# Patient Record
Sex: Male | Born: 1940 | Race: Black or African American | Smoking: Never smoker
Health system: Southern US, Community
[De-identification: ages and names within clinical notes are randomized; demographics above are authoritative.]

## PROBLEM LIST (undated history)

## (undated) DIAGNOSIS — I10 Essential (primary) hypertension: Secondary | ICD-10-CM

## (undated) DIAGNOSIS — E119 Type 2 diabetes mellitus without complications: Secondary | ICD-10-CM

## (undated) DIAGNOSIS — H409 Unspecified glaucoma: Secondary | ICD-10-CM

## (undated) DIAGNOSIS — C61 Malignant neoplasm of prostate: Secondary | ICD-10-CM

## (undated) HISTORY — PX: TONSILECTOMY/ADENOIDECTOMY WITH MYRINGOTOMY: SHX6125

## (undated) HISTORY — PX: PROSTATE BIOPSY: SHX241

---

## 2015-12-25 ENCOUNTER — Ambulatory Visit
Admission: RE | Admit: 2015-12-25 | Discharge: 2015-12-25 | Disposition: A | Discharge status: Home / Self Care | Payer: Medicare Other | Source: Ambulatory Visit | Attending: Radiation Oncology | Admitting: Radiation Oncology

## 2015-12-25 ENCOUNTER — Encounter: Payer: Self-pay | Admitting: Radiation Oncology

## 2015-12-25 ENCOUNTER — Encounter: Payer: Self-pay | Admitting: Medical Oncology

## 2015-12-25 VITALS — BP 174/84 | HR 84 | Wt 211.1 lb

## 2015-12-25 DIAGNOSIS — C61 Malignant neoplasm of prostate: Secondary | ICD-10-CM

## 2015-12-25 DIAGNOSIS — Z510 Encounter for antineoplastic radiation therapy: Secondary | ICD-10-CM | POA: Diagnosis not present

## 2015-12-25 HISTORY — DX: Type 2 diabetes mellitus without complications: E11.9

## 2015-12-25 HISTORY — DX: Essential (primary) hypertension: I10

## 2015-12-25 HISTORY — DX: Malignant neoplasm of prostate: C61

## 2015-12-25 HISTORY — DX: Unspecified glaucoma: H40.9

## 2015-12-25 NOTE — Progress Notes (Signed)
See progress note under physician encounter. 

## 2015-12-25 NOTE — Progress Notes (Signed)
Radiation Oncology         [phone] ________________________________  Initial Outpatient Consultation  Name: Christian Fry MRN: [id number]  Date: 12/25/2015  DOB: 04-27-1941  CC:No primary care provider on file.  Festus Aloe, MD   REFERRING PHYSICIAN: Festus Aloe, MD  DIAGNOSIS: The encounter diagnosis was Malignant neoplasm of prostate [facility]).    ICD-9-CM ICD-10-CM   1. Malignant neoplasm of prostate ([place]) 185 C61     HISTORY OF PRESENT ILLNESS: Christian Fry is a 75 y.o. male with a newly diagnosed prostate cancer. He has been seeing Dr. Janit Bern since 2009 and they have been checking his PSA. He was noted to have an elevated PSA of 5.53 on 06/25/15.  Accordingly, he was referred for evaluation in urology by Dr. Junious Silk on August 08, 2015, his PSA was repeated and was 4.89, digital rectal examination was performed at that time revealing questionable nodule, though on ultrasound this confirmed no nodules.  The patient proceeded to transrectal ultrasound with 12 biopsies of the prostate on 08/27/15.  The prostate volume measured 38 cc.  Out of 12 core biopsies, 5 were positive.  The maximum Gleason score was 3+4, and this was seen in in all biopsies of the left except for the left lateral base which was benign, high volume of disease was present in the three cores medially, shown in the distribution below.    The patient reviewed the biopsy results with his urologist and he has kindly been referred today for discussion of potential radiation treatment options.   PREVIOUS RADIATION THERAPY: No  PAST MEDICAL HISTORY:  Past Medical History:  Diagnosis Date  . Diabetes mellitus without complication ([place])   . Glaucoma   . Hypertension   . Prostate cancer ([place])       PAST SURGICAL HISTORY: Past Surgical History:  Procedure Laterality Date  . PROSTATE BIOPSY    . TONSILECTOMY/ADENOIDECTOMY WITH MYRINGOTOMY      FAMILY HISTORY:  Family History  Problem Relation Age of  Onset  . Diabetes Mother   . Heart attack Father    He mentions he had about 8-10 cousins on his fathers side that have had prostate cancer and is unsure of their age at diagnosis.  SOCIAL HISTORY:  Social History   Social History  . Marital status: Married    Spouse name: N/A  . Number of children: N/A  . Years of education: N/A   Occupational History  . Not on file.   Social History Main Topics  . Smoking status: Never Smoker  . Smokeless tobacco: Never Used  . Alcohol use No  . Drug use: No  . Sexual activity: Not on file   Other Topics Concern  . Not on file   Social History Narrative  . No narrative on file    ALLERGIES: Review of patient's allergies indicates no known allergies.  MEDICATIONS:  Current Outpatient Prescriptions  Medication Sig Dispense Refill  . Canagliflozin (INVOKANA PO) Take by mouth.    . Cholecalciferol (VITAMIN D PO) Take by mouth.    . hydrochlorothiazide (HYDRODIURIL) 25 MG tablet Take 25 mg by mouth daily.    . metFORMIN (GLUCOPHAGE) 1000 MG tablet Take 1,000 mg by mouth 2 (two) times daily with a meal.    . ofloxacin (OCUFLOX) 0.3 % ophthalmic solution 1 drop 4 (four) times daily.     No current facility-administered medications for this encounter.     REVIEW OF SYSTEMS:  On review of systems, the patient reports that  he is doing well overall. He denies any chest pain, shortness of breath, cough, fevers, chills, night sweats, unintended weight changes. He denies any bowel or bladder disturbances, and denies abdominal pain, nausea or vomiting. He denies any new musculoskeletal or joint aches or pains. He mentions he has ED, nocturia x 3, and night sweats. He denies dysuria or hematuria. He denies urinary leakage or incontinence. He mentions he has intermittency and urinary urgency less than half the time. He has an IPSS score of 9, showing moderate urinary symptoms. A complete review of systems is obtained and is otherwise negative.      PHYSICAL EXAM:  weight is 211 lb 1.6 oz (95.8 kg).   Pain Scale 0/10 In general this is a well appearing african [place] male in no acute distress. He is alert and oriented x4 and appropriate throughout the examination. HEENT reveals that the patient is normocephalic, atraumatic. EOMs are intact. PERRLA. Skin is intact without any evidence of gross lesions. Cardiovascular exam reveals a regular rate and rhythm, no clicks rubs or murmurs are auscultated. Chest is clear to auscultation bilaterally. Lymphatic assessment is performed and does not reveal any adenopathy in the cervical, supraclavicular, axillary, or inguinal chains. Abdomen has active bowel sounds in all quadrants and is intact. The abdomen is soft, non tender, non distended. Lower extremities are negative for pretibial pitting edema, deep calf tenderness, cyanosis or clubbing.   KPS = 100  100 - Normal; no complaints; no evidence of disease. 90   - Able to carry on normal activity; minor signs or symptoms of disease. 80   - Normal activity with effort; some signs or symptoms of disease. 53   - Cares for self; unable to carry on normal activity or to do active work. 60   - Requires occasional assistance, but is able to care for most of his personal needs. 50   - Requires considerable assistance and frequent medical care. 23   - Disabled; requires special care and assistance. 33   - Severely disabled; hospital admission is indicated although death not imminent. 7   - Very sick; hospital admission necessary; active supportive treatment necessary. 10   - Moribund; fatal processes progressing rapidly. 0     - Dead  Karnofsky DA, Abelmann WH, Craver LS and Burchenal JH [phone]) The use of the nitrogen mustards in the palliative treatment of carcinoma: with particular reference to bronchogenic carcinoma Cancer 1 [phone]  LABORATORY DATA:  No results found for: WBC, HGB, HCT, MCV, PLT No results found for: NA, K, CL, CO2 No results found  for: ALT, AST, GGT, ALKPHOS, BILITOT   RADIOGRAPHY: No results found.    IMPRESSION: 75 y.o. 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 He is a candidate for radiotherapy and to a lesser degree prostatectomy.  PLAN:  Dr. Tammi Klippel reviewed the findings and workup thus far.  We discussed the natural history of prostate cancer.  We reviewed the the implications of T-stage, Gleason's Score, and PSA on decision-making and outcomes in prostate cancer.  We discussed radiation treatment in the management of prostate cancer with regard to the logistics and delivery of external beam radiation treatment as well as the logistics and delivery of prostate brachytherapy.  We compared and contrasted each of these approaches and also compared these against prostatectomy. Due to age, and medical comorbidities, he is less likely a candidate for prostatectomy which is already been outlined  by Dr. Junious Silk. Also given the volume of disease in the prostate, although not prohibitive we feel that the best treatment for him would be external radiotherapy.The patient expressed interest in external beam radiotherapy. I filled out a patient counseling form for him with relevant treatment diagrams and we retained a copy for our records. Again we discussed the risks, benefits, short and long-term effects of treatment, and I will touch base with him by phone to determine how he wants to move forward. We did discuss the process of obtaining fiducial markers and Dr. Lyndal Rainbow office before proceeding with simulation.  The patient is leaning towards prostate IMRT. We will follow up with him in the near future and discuss if he would like to proceed with IMRT and start scheduling his planning appointment.   The above documentation reflects my direct findings during this shared patient visit. Please see the separate note by Dr. Tammi Klippel on this date for the  remainder of the patient's plan of care.   Carola Rhine, PAC    This document serves as a record of services personally performed by Dr. Tammi Klippel, MD and Shona Simpson, [facility]. It was created on their behalf by Lendon Collar, a trained medical scribe. The creation of this record is based on the scribe's personal observations and the provider's statements to them. This document has been checked and approved by the attending provider.

## 2015-12-25 NOTE — Progress Notes (Signed)
GU Location of Tumor / Histology: prostatic adenocarcinoma  If Prostate Cancer, Gleason Score is (3 + 4) and PSA is (55.53)  Christian Fry initiated prostate screen back in 2012 falling a period of unintentional weight loss.   Oct 2012 2.93 June 2016 4.03 June 2015 5.53  Biopsies of prostate (if applicable) revealed:    Past/Anticipated interventions by urology, if any: screening, biopsy, discussion about tx options, referral to radiation oncology  Past/Anticipated interventions by medical oncology, if any: no  Weight changes, if any: denies any unintentional weight loss.  Bowel/Bladder complaints, if any: ED, nocturia x3, night sweats, IPSS 9. Denies dysuria or hematuria. Denies urinary leakage or incontinence. Reports intermittency and urinary urgency less than half the time.    Nausea/Vomiting, if any: no  Pain issues, if any:  no  SAFETY ISSUES:  Prior radiation? no  Pacemaker/ICD? no  Possible current pregnancy? no  Is the patient on methotrexate? no  Current Complaints / other details:  75 year old male. Married. NKDA. Interested most in brachytherapy. Prostate volume: 38.24 cc.

## 2016-01-07 ENCOUNTER — Telehealth: Payer: Self-pay | Admitting: *Deleted

## 2016-01-07 NOTE — Telephone Encounter (Signed)
PATIENT  TO HAVE 3 GOLD SEEDS PLACED ON 01-08-16 @ [facility] AND HIS SIM WILL BE ON 01-15-16 @ 8 AM WITH DR. MANNING, LVM FOR A RETURN CALL

## 2016-01-15 ENCOUNTER — Ambulatory Visit
Admission: RE | Admit: 2016-01-15 | Discharge: 2016-01-15 | Disposition: A | Discharge status: Home / Self Care | Payer: Medicare Other | Source: Ambulatory Visit | Attending: Radiation Oncology | Admitting: Radiation Oncology

## 2016-01-15 DIAGNOSIS — C61 Malignant neoplasm of prostate: Secondary | ICD-10-CM

## 2016-01-15 DIAGNOSIS — Z510 Encounter for antineoplastic radiation therapy: Secondary | ICD-10-CM | POA: Diagnosis not present

## 2016-01-15 NOTE — Progress Notes (Signed)
  Radiation Oncology         [phone] ________________________________  Name: Christian Fry MRN: [id number]  Date: 01/15/2016  DOB: 28-Feb-1941  SIMULATION AND TREATMENT PLANNING NOTE    ICD-9-CM ICD-10-CM   1. Malignant neoplasm of prostate ([place]) 185 C61     DIAGNOSIS:  75 y.o. 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  Identity was confirmed.  All relevant records and images related to the planned course of therapy were reviewed.  The patient freely provided informed written consent to proceed with treatment after reviewing the details related to the planned course of therapy. The consent form was witnessed and verified by the simulation staff.  Then, the patient was set-up in a stable reproducible supine position for radiation therapy.  A vacuum lock pillow device was custom fabricated to position his legs in a reproducible immobilized position.  Then, I performed a urethrogram under sterile conditions to identify the prostatic apex.  CT images were obtained.  Surface markings were placed.  The CT images were loaded into the planning software.  Then the prostate target and avoidance structures including the rectum, bladder, bowel and hips were contoured.  Treatment planning then occurred.  The radiation prescription was entered and confirmed.  A total of one complex treatment device was fabricated. I have requested : Intensity Modulated Radiotherapy (IMRT) is medically necessary for this case for the following reason:  Rectal sparing.Marland Kitchen  PLAN:  The patient will receive 78 Gy in 40 fractions.  ________________________________  Sheral Apley Tammi Klippel, M.D.

## 2016-01-19 DIAGNOSIS — Z510 Encounter for antineoplastic radiation therapy: Secondary | ICD-10-CM | POA: Diagnosis not present

## 2016-01-20 DIAGNOSIS — Z510 Encounter for antineoplastic radiation therapy: Secondary | ICD-10-CM | POA: Diagnosis not present

## 2016-01-26 ENCOUNTER — Ambulatory Visit
Admission: RE | Admit: 2016-01-26 | Discharge: 2016-01-26 | Disposition: A | Discharge status: Home / Self Care | Payer: Medicare Other | Source: Ambulatory Visit | Attending: Radiation Oncology | Admitting: Radiation Oncology

## 2016-01-26 DIAGNOSIS — Z510 Encounter for antineoplastic radiation therapy: Secondary | ICD-10-CM | POA: Diagnosis not present

## 2016-01-27 ENCOUNTER — Ambulatory Visit
Admission: RE | Admit: 2016-01-27 | Discharge: 2016-01-27 | Disposition: A | Discharge status: Home / Self Care | Payer: Medicare Other | Source: Ambulatory Visit | Attending: Radiation Oncology | Admitting: Radiation Oncology

## 2016-01-27 DIAGNOSIS — Z510 Encounter for antineoplastic radiation therapy: Secondary | ICD-10-CM | POA: Diagnosis not present

## 2016-01-28 ENCOUNTER — Ambulatory Visit
Admission: RE | Admit: 2016-01-28 | Discharge: 2016-01-28 | Disposition: A | Discharge status: Home / Self Care | Payer: Medicare Other | Source: Ambulatory Visit | Attending: Radiation Oncology | Admitting: Radiation Oncology

## 2016-01-28 DIAGNOSIS — Z510 Encounter for antineoplastic radiation therapy: Secondary | ICD-10-CM | POA: Diagnosis not present

## 2016-01-29 ENCOUNTER — Ambulatory Visit
Admission: RE | Admit: 2016-01-29 | Discharge: 2016-01-29 | Disposition: A | Discharge status: Home / Self Care | Payer: Medicare Other | Source: Ambulatory Visit | Attending: Radiation Oncology | Admitting: Radiation Oncology

## 2016-01-29 ENCOUNTER — Encounter: Payer: Self-pay | Admitting: Radiation Oncology

## 2016-01-29 VITALS — BP 181/80 | HR 71 | Resp 16 | Wt 209.3 lb

## 2016-01-29 DIAGNOSIS — Z510 Encounter for antineoplastic radiation therapy: Secondary | ICD-10-CM | POA: Diagnosis not present

## 2016-01-29 DIAGNOSIS — C61 Malignant neoplasm of prostate: Secondary | ICD-10-CM

## 2016-01-29 NOTE — Progress Notes (Signed)
Weight and vitals stable. Denies pain. Reports nocturia x 3. Denies dysuria or hematuria. Denies urinary urgency, leakage or incontinence. Reports occasional difficulty emptying his bladder. Describes his urine stream as steady. Denies urinary frequency. Denies diarrhea, constipation or blood in his stool. Denies fatigue.   BP (!) 181/80 (BP Location: Left Arm, Patient Position: Sitting, Cuff Size: Normal)   Pulse 71   Resp 16   Wt 209 lb 4.8 oz (94.9 kg)   SpO2 100%  Wt Readings from Last 3 Encounters:  01/29/16 209 lb 4.8 oz (94.9 kg)  12/25/15 211 lb 1.6 oz (95.8 kg)

## 2016-01-29 NOTE — Progress Notes (Signed)
  Radiation Oncology         [phone]   Name: Christian Fry MRN: [id number]   Date: 01/29/2016  DOB: 13-May-1941    Weekly Radiation Therapy Management    ICD-9-CM ICD-10-CM   1. Malignant neoplasm of prostate ([place]) 185 C61     Current Dose: 7.8 Gy  Planned Dose:  78 Gy  Narrative The patient presents for routine under treatment assessment. Weight and vitals stable. Denies pain. Reports nocturia x 3. Denies dysuria, urgency, leakage, or incontinence. Reports occasional difficulty emptying bladder. Describes his urinary frequency. Denies diarrhea, constipation, or blood in his stool. He denies fatigue.  The patient is without complaint. Set-up films were reviewed. The chart was checked.  Physical Findings  weight is 209 lb 4.8 oz (94.9 kg). His blood pressure is 181/80 (abnormal) and his pulse is 71. His respiration is 16 and oxygen saturation is 100%. . Weight essentially stable.  No significant changes.  Impression The patient is tolerating radiation.  Plan Continue treatment as planned.         Sheral Apley Tammi Klippel, M.D. This document serves as a record of services personally performed by Tyler Pita, MD. It was created on his behalf by Bethann Humble, a trained medical scribe. The creation of this record is based on the scribe's personal observations and the provider's statements to them. This document has been checked and approved by the attending provider.

## 2016-01-30 ENCOUNTER — Ambulatory Visit
Admission: RE | Admit: 2016-01-30 | Discharge: 2016-01-30 | Disposition: A | Discharge status: Home / Self Care | Payer: Medicare Other | Source: Ambulatory Visit | Attending: Radiation Oncology | Admitting: Radiation Oncology

## 2016-01-30 DIAGNOSIS — Z510 Encounter for antineoplastic radiation therapy: Secondary | ICD-10-CM | POA: Diagnosis not present

## 2016-02-03 ENCOUNTER — Ambulatory Visit
Admission: RE | Admit: 2016-02-03 | Discharge: 2016-02-03 | Disposition: A | Discharge status: Home / Self Care | Payer: Medicare Other | Source: Ambulatory Visit | Attending: Radiation Oncology | Admitting: Radiation Oncology

## 2016-02-03 DIAGNOSIS — Z510 Encounter for antineoplastic radiation therapy: Secondary | ICD-10-CM | POA: Diagnosis not present

## 2016-02-04 ENCOUNTER — Ambulatory Visit
Admission: RE | Admit: 2016-02-04 | Discharge: 2016-02-04 | Disposition: A | Discharge status: Home / Self Care | Payer: Medicare Other | Source: Ambulatory Visit | Attending: Radiation Oncology | Admitting: Radiation Oncology

## 2016-02-04 DIAGNOSIS — Z510 Encounter for antineoplastic radiation therapy: Secondary | ICD-10-CM | POA: Diagnosis not present

## 2016-02-05 ENCOUNTER — Ambulatory Visit
Admission: RE | Admit: 2016-02-05 | Discharge: 2016-02-05 | Disposition: A | Discharge status: Home / Self Care | Payer: Medicare Other | Source: Ambulatory Visit | Attending: Radiation Oncology | Admitting: Radiation Oncology

## 2016-02-05 DIAGNOSIS — Z510 Encounter for antineoplastic radiation therapy: Secondary | ICD-10-CM | POA: Diagnosis not present

## 2016-02-06 ENCOUNTER — Ambulatory Visit
Admission: RE | Admit: 2016-02-06 | Discharge: 2016-02-06 | Disposition: A | Discharge status: Home / Self Care | Payer: Medicare Other | Source: Ambulatory Visit | Attending: Radiation Oncology | Admitting: Radiation Oncology

## 2016-02-06 ENCOUNTER — Encounter: Payer: Self-pay | Admitting: Radiation Oncology

## 2016-02-06 VITALS — BP 169/83 | HR 85 | Temp 97.7°F | Resp 18 | Wt 204.2 lb

## 2016-02-06 DIAGNOSIS — Z510 Encounter for antineoplastic radiation therapy: Secondary | ICD-10-CM | POA: Diagnosis not present

## 2016-02-06 DIAGNOSIS — C61 Malignant neoplasm of prostate: Secondary | ICD-10-CM

## 2016-02-06 NOTE — Progress Notes (Signed)
Weekly rad txs prostate  9/40 completed, no dysuria, or hematuria,  Nocturia x3, good stream, slight increase frequency,empties bladder fully, regular bowels, no pain, appetite good, no fatigue 11:06 AM BP (!) 169/83 (BP Location: Left Arm, Patient Position: Sitting, Cuff Size: Normal)   Pulse 85   Temp 97.7 F (36.5 C) (Oral)   Resp 18   Wt 204 lb 3.2 oz (92.6 kg)   Wt Readings from Last 3 Encounters:  02/06/16 204 lb 3.2 oz (92.6 kg)  01/29/16 209 lb 4.8 oz (94.9 kg)  12/25/15 211 lb 1.6 oz (95.8 kg)

## 2016-02-06 NOTE — Progress Notes (Signed)
  Radiation Oncology         [phone]   Name: Christian Fry MRN: [id number]   Date: 02/06/2016  DOB: 02-Nov-1940    Weekly Radiation Therapy Management    ICD-9-CM ICD-10-CM   1. Malignant neoplasm of prostate ([place]) 185 C61     Current Dose: 17.55 Gy  Planned Dose:  78 Gy  Narrative The patient presents for routine under treatment assessment.  Patient notes a slight increase in frequency. He notes he has a good stream and fully empties his bladder. He denies dysuria, or hematuria. He notes nocturia x 3. He notes regular bowels, no pain, good appetite, and no fatigue.  Set-up films were reviewed. The chart was checked.  Physical Findings  weight is 204 lb 3.2 oz (92.6 kg). His oral temperature is 97.7 F (36.5 C). His blood pressure is 169/83 (abnormal) and his pulse is 85. His respiration is 18. . Weight essentially stable.  No significant changes.  Impression The patient is tolerating radiation.  Plan Continue treatment as planned.    Sheral Apley Tammi Klippel, M.D. This document serves as a record of services personally performed by Tyler Pita, MD. It was created on his behalf by Bethann Humble, a trained medical scribe. The creation of this record is based on the scribe's personal observations and the provider's statements to them. This document has been checked and approved by the attending provider.

## 2016-02-09 ENCOUNTER — Ambulatory Visit
Admission: RE | Admit: 2016-02-09 | Discharge: 2016-02-09 | Disposition: A | Discharge status: Home / Self Care | Payer: Medicare Other | Source: Ambulatory Visit | Attending: Radiation Oncology | Admitting: Radiation Oncology

## 2016-02-09 DIAGNOSIS — Z510 Encounter for antineoplastic radiation therapy: Secondary | ICD-10-CM | POA: Diagnosis not present

## 2016-02-10 ENCOUNTER — Ambulatory Visit
Admission: RE | Admit: 2016-02-10 | Discharge: 2016-02-10 | Disposition: A | Discharge status: Home / Self Care | Payer: Medicare Other | Source: Ambulatory Visit | Attending: Radiation Oncology | Admitting: Radiation Oncology

## 2016-02-10 DIAGNOSIS — Z510 Encounter for antineoplastic radiation therapy: Secondary | ICD-10-CM | POA: Diagnosis not present

## 2016-02-11 ENCOUNTER — Ambulatory Visit
Admission: RE | Admit: 2016-02-11 | Discharge: 2016-02-11 | Disposition: A | Discharge status: Home / Self Care | Payer: Medicare Other | Source: Ambulatory Visit | Attending: Radiation Oncology | Admitting: Radiation Oncology

## 2016-02-11 DIAGNOSIS — Z510 Encounter for antineoplastic radiation therapy: Secondary | ICD-10-CM | POA: Diagnosis not present

## 2016-02-12 ENCOUNTER — Ambulatory Visit
Admission: RE | Admit: 2016-02-12 | Discharge: 2016-02-12 | Disposition: A | Discharge status: Home / Self Care | Payer: Medicare Other | Source: Ambulatory Visit | Attending: Radiation Oncology | Admitting: Radiation Oncology

## 2016-02-12 DIAGNOSIS — Z510 Encounter for antineoplastic radiation therapy: Secondary | ICD-10-CM | POA: Diagnosis not present

## 2016-02-13 ENCOUNTER — Ambulatory Visit
Admission: RE | Admit: 2016-02-13 | Discharge: 2016-02-13 | Disposition: A | Discharge status: Home / Self Care | Payer: Medicare Other | Source: Ambulatory Visit | Attending: Radiation Oncology | Admitting: Radiation Oncology

## 2016-02-13 VITALS — BP 160/82 | HR 78 | Resp 16 | Wt 204.9 lb

## 2016-02-13 DIAGNOSIS — Z510 Encounter for antineoplastic radiation therapy: Secondary | ICD-10-CM | POA: Diagnosis not present

## 2016-02-13 DIAGNOSIS — C61 Malignant neoplasm of prostate: Secondary | ICD-10-CM

## 2016-02-13 NOTE — Progress Notes (Signed)
Weigh and vitals stable. Denies pain. Reports mild intermittent dysuria. Encouraged patient to increased fluid intake. Patient verbalized understanding. Denies hematuria. Reports nocturia x 3. Denies urinary leakage or incontinence. Describes a steady urine stream without difficulty emptying his bladder. Denies diarrhea. Reports mild fatigue.   BP (!) 160/82 (BP Location: Left Arm, Patient Position: Sitting, Cuff Size: Normal)   Pulse 78   Resp 16   Wt 204 lb 14.4 oz (92.9 kg)   SpO2 100%  Wt Readings from Last 3 Encounters:  02/13/16 204 lb 14.4 oz (92.9 kg)  02/06/16 204 lb 3.2 oz (92.6 kg)  01/29/16 209 lb 4.8 oz (94.9 kg)

## 2016-02-13 NOTE — Progress Notes (Signed)
  Radiation Oncology         [phone]   Name: Christian Fry MRN: [id number]   Date: 02/13/2016  DOB: 12/07/1940    Weekly Radiation Therapy Management    ICD-9-CM ICD-10-CM   1. Malignant neoplasm of prostate ([place]) 185 C61     Current Dose: 27.3 Gy  Planned Dose:  78 Gy  Narrative The patient presents for routine under treatment assessment.  Denies pain. Reports mild intermittent dysuria. The nurse encouraged the patient to increased fluid intake. Patient verbalized understanding. Denies hematuria. Reports nocturia x 3. Denies urinary leakage or incontinence. Describes a steady urine stream without difficulty emptying his bladder. Denies diarrhea. Reports mild fatigue.  Set-up films were reviewed. The chart was checked.  Physical Findings  weight is 204 lb 14.4 oz (92.9 kg). His blood pressure is 160/82 (abnormal) and his pulse is 78. His respiration is 16 and oxygen saturation is 100%. . Weight essentially stable.  No significant changes.  Impression The patient is tolerating radiation.  Plan Continue treatment as planned.    Sheral Apley Tammi Klippel, M.D.  This document serves as a record of services personally performed by Tyler Pita, MD. It was created on his behalf by Darcus Austin, a trained medical scribe. The creation of this record is based on the scribe's personal observations and the provider's statements to them. This document has been checked and approved by the attending provider.

## 2016-02-16 ENCOUNTER — Ambulatory Visit
Admission: RE | Admit: 2016-02-16 | Discharge: 2016-02-16 | Disposition: A | Discharge status: Home / Self Care | Payer: Medicare Other | Source: Ambulatory Visit | Attending: Radiation Oncology | Admitting: Radiation Oncology

## 2016-02-16 DIAGNOSIS — Z510 Encounter for antineoplastic radiation therapy: Secondary | ICD-10-CM | POA: Diagnosis not present

## 2016-02-17 ENCOUNTER — Ambulatory Visit
Admission: RE | Admit: 2016-02-17 | Discharge: 2016-02-17 | Disposition: A | Discharge status: Home / Self Care | Payer: Medicare Other | Source: Ambulatory Visit | Attending: Radiation Oncology | Admitting: Radiation Oncology

## 2016-02-17 DIAGNOSIS — Z510 Encounter for antineoplastic radiation therapy: Secondary | ICD-10-CM | POA: Diagnosis not present

## 2016-02-18 ENCOUNTER — Ambulatory Visit
Admission: RE | Admit: 2016-02-18 | Discharge: 2016-02-18 | Disposition: A | Discharge status: Home / Self Care | Payer: Medicare Other | Source: Ambulatory Visit | Attending: Radiation Oncology | Admitting: Radiation Oncology

## 2016-02-18 DIAGNOSIS — Z510 Encounter for antineoplastic radiation therapy: Secondary | ICD-10-CM | POA: Diagnosis not present

## 2016-02-19 ENCOUNTER — Ambulatory Visit
Admission: RE | Admit: 2016-02-19 | Discharge: 2016-02-19 | Disposition: A | Discharge status: Home / Self Care | Payer: Medicare Other | Source: Ambulatory Visit | Attending: Radiation Oncology | Admitting: Radiation Oncology

## 2016-02-19 DIAGNOSIS — Z510 Encounter for antineoplastic radiation therapy: Secondary | ICD-10-CM | POA: Diagnosis not present

## 2016-02-20 ENCOUNTER — Ambulatory Visit
Admission: RE | Admit: 2016-02-20 | Discharge: 2016-02-20 | Disposition: A | Discharge status: Home / Self Care | Payer: Medicare Other | Source: Ambulatory Visit | Attending: Radiation Oncology | Admitting: Radiation Oncology

## 2016-02-20 ENCOUNTER — Encounter: Payer: Self-pay | Admitting: Radiation Oncology

## 2016-02-20 VITALS — BP 132/78 | HR 86 | Temp 98.5°F | Resp 18 | Ht 66.0 in | Wt 203.9 lb

## 2016-02-20 DIAGNOSIS — C61 Malignant neoplasm of prostate: Secondary | ICD-10-CM

## 2016-02-20 DIAGNOSIS — Z510 Encounter for antineoplastic radiation therapy: Secondary | ICD-10-CM | POA: Diagnosis not present

## 2016-02-20 NOTE — Progress Notes (Signed)
Weigh and vitals stable. Denies pain. Reports mild intermittent dysuria. He states he is working on  increasing fluid intake. Patient verbalized understanding. Denies hematuria. Reports nocturia x 3. Denies urinary leakage or incontinence. Describes a steady urine stream without difficulty emptying his bladder. Denies diarrhea. Reports mild fatigue is ongoing. Wt Readings from Last 3 Encounters:  02/20/16 203 lb 14.4 oz (92.5 kg)  02/13/16 204 lb 14.4 oz (92.9 kg)  02/06/16 204 lb 3.2 oz (92.6 kg)  BP 132/78 (BP Location: Right Arm, Patient Position: Sitting, Cuff Size: Normal)   Pulse 86   Temp 98.5 F (36.9 C) (Oral)   Resp 18   Ht 5\' 6"  (1.676 m)   Wt 203 lb 14.4 oz (92.5 kg)   SpO2 100%   BMI 32.91 kg/m

## 2016-02-20 NOTE — Progress Notes (Signed)
   Department of Radiation Oncology  Phone:  [phone] Fax:        [phone]  Weekly Treatment Note    Name: Christian Fry Date: 02/21/2016 MRN: [id number] DOB: 1941/04/27   Diagnosis:     ICD-9-CM ICD-10-CM   1. Malignant neoplasm of prostate ([place]) 185 C61      Current dose: 37 Gy  Current fraction: 19   MEDICATIONS: Current Outpatient Prescriptions  Medication Sig Dispense Refill  . Canagliflozin (INVOKANA PO) Take by mouth.    . Cholecalciferol (VITAMIN D PO) Take by mouth.    . hydrochlorothiazide (HYDRODIURIL) 25 MG tablet Take 25 mg by mouth daily.    . metFORMIN (GLUCOPHAGE) 1000 MG tablet Take 1,000 mg by mouth 2 (two) times daily with a meal.    . ofloxacin (OCUFLOX) 0.3 % ophthalmic solution 1 drop 4 (four) times daily.     No current facility-administered medications for this encounter.      ALLERGIES: Review of patient's allergies indicates no known allergies.   LABORATORY DATA:  No results found for: WBC, HGB, HCT, MCV, PLT No results found for: NA, K, CL, CO2 No results found for: ALT, AST, GGT, ALKPHOS, BILITOT   NARRATIVE: Christian Fry was seen today for weekly treatment management. The chart was checked and the patient's films were reviewed.  Weight and vitals stable. Denies pain. Reports mild intermittent dysuria. He states he is working on increasing fluid intake. Patient verbalized understanding. Denies hematuria. Reports nocturia x 3. Denies urinary leakage or incontinence. Describes a steady urine stream without difficulty emptying his bladder. Denies diarrhea. Reports mild fatigue is ongoing.  PHYSICAL EXAMINATION: height is 5\' 6"  (1.676 m) and weight is 203 lb 14.4 oz (92.5 kg). His oral temperature is 98.5 F (36.9 C). His blood pressure is 132/78 and his pulse is 86. His respiration is 18 and oxygen saturation is 100%.       ASSESSMENT: The patient is doing satisfactorily with treatment.  PLAN: We will continue with the patient's  radiation treatment as planned.        This document serves as a record of services personally performed by Kyung Rudd, MD. It was created on his behalf by Bethann Humble, a trained medical scribe. The creation of this record is based on the scribe's personal observations and the provider's statements to them. This document has been checked and approved by the attending provider.

## 2016-02-23 ENCOUNTER — Ambulatory Visit
Admission: RE | Admit: 2016-02-23 | Discharge: 2016-02-23 | Disposition: A | Discharge status: Home / Self Care | Payer: Medicare Other | Source: Ambulatory Visit | Attending: Radiation Oncology | Admitting: Radiation Oncology

## 2016-02-23 DIAGNOSIS — Z510 Encounter for antineoplastic radiation therapy: Secondary | ICD-10-CM | POA: Diagnosis not present

## 2016-02-24 ENCOUNTER — Ambulatory Visit
Admission: RE | Admit: 2016-02-24 | Discharge: 2016-02-24 | Disposition: A | Discharge status: Home / Self Care | Payer: Medicare Other | Source: Ambulatory Visit | Attending: Radiation Oncology | Admitting: Radiation Oncology

## 2016-02-24 DIAGNOSIS — Z510 Encounter for antineoplastic radiation therapy: Secondary | ICD-10-CM | POA: Diagnosis not present

## 2016-02-25 ENCOUNTER — Ambulatory Visit
Admission: RE | Admit: 2016-02-25 | Discharge: 2016-02-25 | Disposition: A | Discharge status: Home / Self Care | Payer: Medicare Other | Source: Ambulatory Visit | Attending: Radiation Oncology | Admitting: Radiation Oncology

## 2016-02-25 DIAGNOSIS — Z510 Encounter for antineoplastic radiation therapy: Secondary | ICD-10-CM | POA: Diagnosis not present

## 2016-02-26 ENCOUNTER — Ambulatory Visit
Admission: RE | Admit: 2016-02-26 | Discharge: 2016-02-26 | Disposition: A | Discharge status: Home / Self Care | Payer: Medicare Other | Source: Ambulatory Visit | Attending: Radiation Oncology | Admitting: Radiation Oncology

## 2016-02-26 ENCOUNTER — Inpatient Hospital Stay: Admission: RE | Admit: 2016-02-26 | Payer: Self-pay | Source: Ambulatory Visit | Admitting: Radiation Oncology

## 2016-02-26 DIAGNOSIS — Z510 Encounter for antineoplastic radiation therapy: Secondary | ICD-10-CM | POA: Diagnosis not present

## 2016-02-27 ENCOUNTER — Ambulatory Visit
Admission: RE | Admit: 2016-02-27 | Discharge: 2016-02-27 | Disposition: A | Discharge status: Home / Self Care | Payer: Medicare Other | Source: Ambulatory Visit | Attending: Radiation Oncology | Admitting: Radiation Oncology

## 2016-02-27 VITALS — BP 166/82 | HR 79 | Temp 97.8°F | Resp 18 | Ht 66.0 in | Wt 204.1 lb

## 2016-02-27 DIAGNOSIS — C61 Malignant neoplasm of prostate: Secondary | ICD-10-CM

## 2016-02-27 DIAGNOSIS — Z510 Encounter for antineoplastic radiation therapy: Secondary | ICD-10-CM | POA: Diagnosis not present

## 2016-02-27 NOTE — Progress Notes (Signed)
  Radiation Oncology         [phone]   Name: Christian Fry MRN: [id number]   Date: 02/27/2016  DOB: 1940-08-07    Weekly Radiation Therapy Management    ICD-9-CM ICD-10-CM   1. Malignant neoplasm of prostate ([facility]) 185 C61     Current Dose: 46.8 Gy  Planned Dose:  78 Gy  Narrative The patient presents for routine under treatment assessment.  Denies pain. Reports mild intermittent dysuria.  He report moist desquamation on right fold under the abdomen. He states he is working on increasingfluid intake. Denies hematuria. Reports nocturia x 3. Denies urinary leakage or incontinence. Describes a steady urine stream without difficulty emptying his bladder. Denies diarrhea. Reports mild fatigue is ongoing.  Set-up films were reviewed. The chart was checked.  Physical Findings  height is 5\' 6"  (1.676 m) and weight is 204 lb 1.6 oz (92.6 kg). His oral temperature is 97.8 F (36.6 C). His blood pressure is 166/82 (abnormal) and his pulse is 79. His respiration is 18 and oxygen saturation is 100%. . Weight essentially stable.  No significant changes.  Erythema with possible candidiasis in right pannus fold.  Impression The patient is tolerating radiation.  Plan Continue treatment as planned. Gave miconazole powder.    Sheral Apley Tammi Klippel, M.D.  This document serves as a record of services personally performed by Tyler Pita, MD. It was created on his behalf by Darcus Austin, a trained medical scribe. The creation of this record is based on the scribe's personal observations and the provider's statements to them. This document has been checked and approved by the attending provider.

## 2016-02-27 NOTE — Progress Notes (Signed)
Weigh and vitals stable. Denies pain. Reports mild intermittent dysuria.  Has moist desquamation on right fold under the abdomen.   He states he is working on  increasing fluid intake. Patient verbalized understanding. Denies hematuria. Reports nocturia x 3. Denies urinary leakage or incontinence. Describes a steady urine stream without difficulty emptying his bladder. Denies diarrhea. Reports mild fatigue is ongoing.  Wife has been in the hospital for the past three days very concerned about her hospitalization. Wt Readings from Last 3 Encounters:  02/27/16 204 lb 1.6 oz (92.6 kg)  02/20/16 203 lb 14.4 oz (92.5 kg)  02/13/16 204 lb 14.4 oz (92.9 kg)  BP (!) 166/82 (BP Location: Right Arm, Patient Position: Sitting, Cuff Size: Normal)   Pulse 79   Temp 97.8 F (36.6 C) (Oral)   Resp 18   Ht 5\' 6"  (1.676 m)   Wt 204 lb 1.6 oz (92.6 kg)   SpO2 100%   BMI 32.94 kg/m

## 2016-03-01 ENCOUNTER — Ambulatory Visit
Admission: RE | Admit: 2016-03-01 | Discharge: 2016-03-01 | Disposition: A | Discharge status: Home / Self Care | Payer: Medicare Other | Source: Ambulatory Visit | Attending: Radiation Oncology | Admitting: Radiation Oncology

## 2016-03-01 DIAGNOSIS — Z510 Encounter for antineoplastic radiation therapy: Secondary | ICD-10-CM | POA: Diagnosis not present

## 2016-03-02 ENCOUNTER — Ambulatory Visit
Admission: RE | Admit: 2016-03-02 | Discharge: 2016-03-02 | Disposition: A | Discharge status: Home / Self Care | Payer: Medicare Other | Source: Ambulatory Visit | Attending: Radiation Oncology | Admitting: Radiation Oncology

## 2016-03-02 DIAGNOSIS — Z510 Encounter for antineoplastic radiation therapy: Secondary | ICD-10-CM | POA: Diagnosis not present

## 2016-03-03 ENCOUNTER — Ambulatory Visit
Admission: RE | Admit: 2016-03-03 | Discharge: 2016-03-03 | Disposition: A | Discharge status: Home / Self Care | Payer: Medicare Other | Source: Ambulatory Visit | Attending: Radiation Oncology | Admitting: Radiation Oncology

## 2016-03-03 DIAGNOSIS — Z510 Encounter for antineoplastic radiation therapy: Secondary | ICD-10-CM | POA: Diagnosis not present

## 2016-03-04 ENCOUNTER — Ambulatory Visit
Admission: RE | Admit: 2016-03-04 | Discharge: 2016-03-04 | Disposition: A | Discharge status: Home / Self Care | Payer: Medicare Other | Source: Ambulatory Visit | Attending: Radiation Oncology | Admitting: Radiation Oncology

## 2016-03-04 DIAGNOSIS — Z510 Encounter for antineoplastic radiation therapy: Secondary | ICD-10-CM | POA: Diagnosis not present

## 2016-03-05 ENCOUNTER — Ambulatory Visit
Admission: RE | Admit: 2016-03-05 | Discharge: 2016-03-05 | Disposition: A | Discharge status: Home / Self Care | Payer: Medicare Other | Source: Ambulatory Visit | Attending: Radiation Oncology | Admitting: Radiation Oncology

## 2016-03-05 ENCOUNTER — Encounter: Payer: Self-pay | Admitting: Radiation Oncology

## 2016-03-05 VITALS — BP 171/78 | HR 75 | Temp 97.9°F | Resp 20 | Wt 200.4 lb

## 2016-03-05 DIAGNOSIS — Z510 Encounter for antineoplastic radiation therapy: Secondary | ICD-10-CM | POA: Diagnosis not present

## 2016-03-05 DIAGNOSIS — C61 Malignant neoplasm of prostate: Secondary | ICD-10-CM

## 2016-03-05 NOTE — Progress Notes (Signed)
  Radiation Oncology         [phone]   Name: Christian Fry MRN: [id number]   Date: 03/05/2016  DOB: [birth year]    Weekly Radiation Therapy Management    ICD-9-CM ICD-10-CM   1. Malignant neoplasm of prostate (HCC) 185 C61     Current Dose: 56.55 Gy  Planned Dose:  78 Gy  Narrative The patient presents for routine under treatment assessment.  Weekly rad txs prostate 29/40 completed. Reports intermittent dysuria,nocturia x3, incomplete emptying. occasional loose stools, and a good appetite. Denies rectal irritation,  Set-up films were reviewed. The chart was checked.  Physical Findings  weight is 200 lb 6.4 oz (90.9 kg). His oral temperature is 97.9 F (36.6 C). His blood pressure is 171/78 (abnormal) and his pulse is 75. His respiration is 20. . Weight essentially stable.  No significant changes.  Impression The patient is tolerating radiation.  Plan Continue treatment as planned. I advised the patient to drink more water before his treatments to fill his bladder.    Sheral Apley Tammi Klippel, M.D.  This document serves as a record of services personally performed by Tyler Pita, MD. It was created on his behalf by Darcus Austin, a trained medical scribe. The creation of this record is based on the scribe's personal observations and the provider's statements to them. This document has been checked and approved by the attending provider.

## 2016-03-05 NOTE — Progress Notes (Signed)
Weekly rad txs prostate 29/40 completed, having intermittent dysuria,nocturia x3, incomplete emptying, no rectal irritation, occasional loose stools, appetite good 9:04 AM BP (!) 171/78 (BP Location: Left Arm, Patient Position: Sitting, Cuff Size: Normal)   Pulse 75   Temp 97.9 F (36.6 C) (Oral)   Resp 20   Wt 200 lb 6.4 oz (90.9 kg)   BMI 32.35 kg/m   Wt Readings from Last 3 Encounters:  03/05/16 200 lb 6.4 oz (90.9 kg)  02/27/16 204 lb 1.6 oz (92.6 kg)  02/20/16 203 lb 14.4 oz (92.5 kg)

## 2016-03-08 ENCOUNTER — Ambulatory Visit
Admission: RE | Admit: 2016-03-08 | Discharge: 2016-03-08 | Disposition: A | Discharge status: Home / Self Care | Payer: Medicare Other | Source: Ambulatory Visit | Attending: Radiation Oncology | Admitting: Radiation Oncology

## 2016-03-08 DIAGNOSIS — Z510 Encounter for antineoplastic radiation therapy: Secondary | ICD-10-CM | POA: Diagnosis not present

## 2016-03-09 ENCOUNTER — Ambulatory Visit
Admission: RE | Admit: 2016-03-09 | Discharge: 2016-03-09 | Disposition: A | Discharge status: Home / Self Care | Payer: Medicare Other | Source: Ambulatory Visit | Attending: Radiation Oncology | Admitting: Radiation Oncology

## 2016-03-09 DIAGNOSIS — Z510 Encounter for antineoplastic radiation therapy: Secondary | ICD-10-CM | POA: Diagnosis not present

## 2016-03-10 ENCOUNTER — Ambulatory Visit
Admission: RE | Admit: 2016-03-10 | Discharge: 2016-03-10 | Disposition: A | Discharge status: Home / Self Care | Payer: Medicare Other | Source: Ambulatory Visit | Attending: Radiation Oncology | Admitting: Radiation Oncology

## 2016-03-10 DIAGNOSIS — Z510 Encounter for antineoplastic radiation therapy: Secondary | ICD-10-CM | POA: Diagnosis not present

## 2016-03-11 ENCOUNTER — Encounter: Payer: Self-pay | Admitting: Radiation Oncology

## 2016-03-11 ENCOUNTER — Ambulatory Visit
Admission: RE | Admit: 2016-03-11 | Discharge: 2016-03-11 | Disposition: A | Discharge status: Home / Self Care | Payer: Medicare Other | Source: Ambulatory Visit | Attending: Radiation Oncology | Admitting: Radiation Oncology

## 2016-03-11 VITALS — BP 167/72 | HR 76 | Temp 97.6°F | Resp 18 | Ht 66.0 in | Wt 201.2 lb

## 2016-03-11 DIAGNOSIS — Z510 Encounter for antineoplastic radiation therapy: Secondary | ICD-10-CM | POA: Diagnosis not present

## 2016-03-11 DIAGNOSIS — C61 Malignant neoplasm of prostate: Secondary | ICD-10-CM

## 2016-03-11 NOTE — Progress Notes (Signed)
Weekly rad txs prostate 33/40 completed, having intermittent dysuria,nocturia x3, incomplete emptying, no rectal irritation, occasional loose stools, appetite good Wt Readings from Last 3 Encounters:  03/11/16 201 lb 3.2 oz (91.3 kg)  03/05/16 200 lb 6.4 oz (90.9 kg)  02/27/16 204 lb 1.6 oz (92.6 kg)  BP (!) 167/72 (BP Location: Right Arm, Patient Position: Sitting, Cuff Size: Normal)   Pulse 76   Temp 97.6 F (36.4 C) (Oral)   Resp 18   Ht 5\' 6"  (1.676 m)   Wt 201 lb 3.2 oz (91.3 kg)   SpO2 100%   BMI 32.47 kg/m

## 2016-03-11 NOTE — Progress Notes (Signed)
  Radiation Oncology         [phone]   Name: Christian Fry MRN: [id number]   Date: 03/11/2016  DOB: 1940/12/05    Weekly Radiation Therapy Management    ICD-9-CM ICD-10-CM   1. Malignant neoplasm of prostate (HCC) 185 C61     Current Dose: 64.35 Gy  Planned Dose:  78 Gy  Narrative The patient presents for routine under treatment assessment.  Weekly rad txs prostate 33/40 completed. Having intermittent dysuria, nocturia x 3, incomplete emptying. Denies rectal irritation. Occasional loose stools, good appetite.   Set-up films were reviewed. The chart was checked.  Physical Findings  height is 5\' 6"  (1.676 m) and weight is 201 lb 3.2 oz (91.3 kg). His oral temperature is 97.6 F (36.4 C). His blood pressure is 167/72 (abnormal) and his pulse is 76. His respiration is 18 and oxygen saturation is 100%. . Weight essentially stable.  No significant changes.  Impression The patient is tolerating radiation.  Plan Continue treatment as planned. I recommend taking Imodium AD 1-2x daily for predictable diarrhea, if needed.    Sheral Apley Tammi Klippel, M.D.  This document serves as a record of services personally performed by Tyler Pita, MD. It was created on his behalf by Maryla Morrow, a trained medical scribe. The creation of this record is based on the scribe's personal observations and the provider's statements to them. This document has been checked and approved by the attending provider.

## 2016-03-12 ENCOUNTER — Ambulatory Visit
Admission: RE | Admit: 2016-03-12 | Discharge: 2016-03-12 | Disposition: A | Discharge status: Home / Self Care | Payer: Medicare Other | Source: Ambulatory Visit | Attending: Radiation Oncology | Admitting: Radiation Oncology

## 2016-03-12 DIAGNOSIS — Z510 Encounter for antineoplastic radiation therapy: Secondary | ICD-10-CM | POA: Diagnosis not present

## 2016-03-15 ENCOUNTER — Ambulatory Visit
Admission: RE | Admit: 2016-03-15 | Discharge: 2016-03-15 | Disposition: A | Discharge status: Home / Self Care | Payer: Medicare Other | Source: Ambulatory Visit | Attending: Radiation Oncology | Admitting: Radiation Oncology

## 2016-03-15 DIAGNOSIS — Z510 Encounter for antineoplastic radiation therapy: Secondary | ICD-10-CM | POA: Diagnosis not present

## 2016-03-16 ENCOUNTER — Ambulatory Visit
Admission: RE | Admit: 2016-03-16 | Discharge: 2016-03-16 | Disposition: A | Discharge status: Home / Self Care | Payer: Medicare Other | Source: Ambulatory Visit | Attending: Radiation Oncology | Admitting: Radiation Oncology

## 2016-03-16 DIAGNOSIS — Z510 Encounter for antineoplastic radiation therapy: Secondary | ICD-10-CM | POA: Diagnosis not present

## 2016-03-17 ENCOUNTER — Ambulatory Visit
Admission: RE | Admit: 2016-03-17 | Discharge: 2016-03-17 | Disposition: A | Discharge status: Home / Self Care | Payer: Medicare Other | Source: Ambulatory Visit | Attending: Radiation Oncology | Admitting: Radiation Oncology

## 2016-03-17 DIAGNOSIS — Z510 Encounter for antineoplastic radiation therapy: Secondary | ICD-10-CM | POA: Diagnosis not present

## 2016-03-18 ENCOUNTER — Ambulatory Visit
Admission: RE | Admit: 2016-03-18 | Discharge: 2016-03-18 | Disposition: A | Discharge status: Home / Self Care | Payer: Medicare Other | Source: Ambulatory Visit | Attending: Radiation Oncology | Admitting: Radiation Oncology

## 2016-03-18 ENCOUNTER — Encounter: Payer: Self-pay | Admitting: Radiation Oncology

## 2016-03-18 VITALS — BP 153/68 | HR 80 | Temp 97.6°F | Resp 18 | Ht 66.0 in | Wt 202.8 lb

## 2016-03-18 DIAGNOSIS — Z510 Encounter for antineoplastic radiation therapy: Secondary | ICD-10-CM | POA: Diagnosis not present

## 2016-03-18 DIAGNOSIS — C61 Malignant neoplasm of prostate: Secondary | ICD-10-CM

## 2016-03-18 NOTE — Progress Notes (Addendum)
Weekly rad txs prostate 3840 completed, having intermittent dysuria,nocturia x3, incomplete emptying, no rectal irritation, occasional loose stools, appetite good.  EOT instructions one card given to see Shona Simpson, P.A. See documentation under education. Wt Readings from Last 3 Encounters:  03/18/16 202 lb 12.8 oz (92 kg)  03/11/16 201 lb 3.2 oz (91.3 kg)  03/05/16 200 lb 6.4 oz (90.9 kg)  BP (!) 153/68 (BP Location: Right Arm, Patient Position: Sitting, Cuff Size: Large)   Pulse 80   Temp 97.6 F (36.4 C) (Oral)   Resp 18   Ht 5\' 6"  (1.676 m)   Wt 202 lb 12.8 oz (92 kg)   SpO2 100%   BMI 32.73 kg/m

## 2016-03-18 NOTE — Progress Notes (Signed)
  Radiation Oncology         [phone]   Name: Christian Fry MRN: [id number]   Date: 03/18/2016  DOB: 10-26-1940    Weekly Radiation Therapy Management    ICD-9-CM ICD-10-CM   1. Malignant neoplasm of prostate (HCC) 185 C61     Current Dose: 74.1 Gy  Planned Dose:  78 Gy  Narrative The patient presents for routine under treatment assessment.  Weekly rad txs prostate 38/40 completed. He reports having intermittent dysuria. Nocturia x3. He reports incomplete emptying. Denies rectal irritation. He reports occasional loose stools. Appetite good.  Set-up films were reviewed. The chart was checked.  Physical Findings  height is 5\' 6"  (1.676 m) and weight is 202 lb 12.8 oz (92 kg). His oral temperature is 97.6 F (36.4 C). His blood pressure is 153/68 (abnormal) and his pulse is 80. His respiration is 18 and oxygen saturation is 100%. . Weight essentially stable.  No significant changes.  Impression The patient is tolerating radiation.  Plan Continue treatment as planned. He is scheduled to complete treatment early next week. He will return for follow up in 1 month.    Sheral Apley Tammi Klippel, M.D.  This document serves as a record of services personally performed by Tyler Pita, MD. It was created on his behalf by Arlyce Harman, a trained medical scribe. The creation of this record is based on the scribe's personal observations and the provider's statements to them. This document has been checked and approved by the attending provider.

## 2016-03-19 ENCOUNTER — Ambulatory Visit
Admission: RE | Admit: 2016-03-19 | Discharge: 2016-03-19 | Disposition: A | Discharge status: Home / Self Care | Payer: Medicare Other | Source: Ambulatory Visit | Attending: Radiation Oncology | Admitting: Radiation Oncology

## 2016-03-19 DIAGNOSIS — Z510 Encounter for antineoplastic radiation therapy: Secondary | ICD-10-CM | POA: Diagnosis not present

## 2016-03-22 ENCOUNTER — Encounter: Payer: Self-pay | Admitting: Radiation Oncology

## 2016-03-22 ENCOUNTER — Ambulatory Visit
Admission: RE | Admit: 2016-03-22 | Discharge: 2016-03-22 | Disposition: A | Discharge status: Home / Self Care | Payer: Medicare Other | Source: Ambulatory Visit | Attending: Radiation Oncology | Admitting: Radiation Oncology

## 2016-03-22 DIAGNOSIS — Z510 Encounter for antineoplastic radiation therapy: Secondary | ICD-10-CM | POA: Diagnosis not present

## 2016-03-28 NOTE — Progress Notes (Signed)
  Radiation Oncology         [phone] ________________________________  Name: Calian Horng MRN: [id number]  Date: 03/22/2016  DOB: 09/12/1940  End of Treatment Note  Diagnosis:   75 y.o. 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  Image guidance was performed with daily cone beam CT prior to each fraction to align to gold markers in the prostate and assure proper bladder and rectal fill volumes.  Immobilization was achieved with BodyFix custom mold.  Narrative: The patient tolerated radiation treatment relatively well.   The patient experienced some minor urinary irritation and modest fatigue.  He reports having intermittent dysuria. Nocturia x3. He reports incomplete emptying. Denies rectal irritation. He reports occasional loose stools. Appetite good.  Plan: The patient has completed radiation treatment. He will return to radiation oncology clinic for routine followup in one month. I advised him to call or return sooner if he has any questions or concerns related to his recovery or treatment. ________________________________  Sheral Apley. Tammi Klippel, M.D.

## 2016-04-05 ENCOUNTER — Encounter: Payer: Self-pay | Admitting: Medical Oncology

## 2016-04-05 NOTE — Progress Notes (Signed)
Left a message to follow up with patient after completion of radiation. Asked him to call if any questions or concerns.

## 2016-05-04 ENCOUNTER — Ambulatory Visit
Admission: RE | Admit: 2016-05-04 | Discharge: 2016-05-04 | Disposition: A | Discharge status: Home / Self Care | Payer: Medicare Other | Source: Ambulatory Visit | Attending: Radiation Oncology | Admitting: Radiation Oncology

## 2016-05-04 ENCOUNTER — Encounter: Payer: Self-pay | Admitting: Radiation Oncology

## 2016-05-04 VITALS — BP 143/65 | HR 84 | Temp 98.8°F | Resp 18 | Ht 66.0 in | Wt 216.0 lb

## 2016-05-04 DIAGNOSIS — Z7984 Long term (current) use of oral hypoglycemic drugs: Secondary | ICD-10-CM | POA: Insufficient documentation

## 2016-05-04 DIAGNOSIS — C61 Malignant neoplasm of prostate: Secondary | ICD-10-CM | POA: Diagnosis not present

## 2016-05-04 NOTE — Progress Notes (Signed)
  Radiation Oncology         [phone] ________________________________  Name: Christian Fry MRN: [id number]  Date: 05/04/2016  DOB: November 08, 1940  Post Treatment Note  CC: No primary care provider on file.  Festus Aloe, MD  Diagnosis:   75 y.o. 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     Interval Since Last Radiation: 6 weeks   01/26/16-03/22/16: The prostate was treated to 78 Gy in 40 fractions of 1.95 Gy  Narrative:  The patient returns today for routine follow-up. The patient tolerated radiotherapy well with minimal treatment related symptoms including modest fatigue, and intermittent dysuria. He did have nocturia x3 prior to treatment and attributes this to his liquid intake at HS and through the night if he's thirsty. Unfortunately his wife passed away unexpectedly during his radiation treatment. Despite this, he felt as though he could complete his treatment without taking time off.                             On review of systems, the patient states he's doing great. He continues to have nocturia x3-4, and reports no progressive symptoms or dysuria, hematuria, or frequency. He denies any bowel dysfunction. No other complaints are noted.   ALLERGIES:  has No Known Allergies.  Meds: Current Outpatient Prescriptions  Medication Sig Dispense Refill  . Canagliflozin (INVOKANA PO) Take by mouth.    . Cholecalciferol (VITAMIN D PO) Take by mouth.    . hydrochlorothiazide (HYDRODIURIL) 25 MG tablet Take 25 mg by mouth daily.    . metFORMIN (GLUCOPHAGE) 1000 MG tablet Take 1,000 mg by mouth 2 (two) times daily with a meal.    . ofloxacin (OCUFLOX) 0.3 % ophthalmic solution 1 drop 4 (four) times daily.     No current facility-administered medications for this encounter.     Physical Findings:  height is 5\' 6"  (1.676 m) and weight is 216 lb (98 kg). His oral temperature is 98.8 F (37.1 C). His blood  pressure is 143/65 (abnormal) and his pulse is 84. His respiration is 18 and oxygen saturation is 100%.  In general this is a well appearing African American male in no acute distress. He's alert and oriented x4 and appropriate throughout the examination. Cardiopulmonary assessment is negative for acute distress and he exhibits normal effort.   Lab Findings: No results found for: WBC, HGB, HCT, MCV, PLT   Radiographic Findings: No results found.  Impression/Plan: 1. 75 y.o. 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 The patient has tolerated radiotherapy well and will move forward with seeing Dr. Junious Silk in January 2018. He will keep [place] informed of questions or concerns regarding radiotherapy and we will see him back as needed.        Carola Rhine, PAC

## 2016-05-04 NOTE — Progress Notes (Signed)
Mr. Christian Fry is here for an one month follow up appointment for prostate cancer.  Denies dysuria,  hematuria or diarrhea.  Reports nocturia x3,  Feels that he is completely emptying his bladder and has a strong urinary stream,  Not having urgency of frequency to void. appetite good. Wt Readings from Last 3 Encounters:  05/04/16 216 lb (98 kg)  03/18/16 202 lb 12.8 oz (92 kg)  03/11/16 201 lb 3.2 oz (91.3 kg)  Weight loss of 14 pounds, wife died 05-Apr-2016. BP (!) 143/65   Pulse 84   Temp 98.8 F (37.1 C) (Oral)   Resp 18   Ht 5\' 6"  (1.676 m)   Wt 216 lb (98 kg)   SpO2 100%   BMI 34.86 kg/m

## 2018-06-26 ENCOUNTER — Emergency Department (HOSPITAL_COMMUNITY): Payer: Medicare Other

## 2018-06-26 ENCOUNTER — Encounter (HOSPITAL_COMMUNITY): Payer: Self-pay | Admitting: Emergency Medicine

## 2018-06-26 ENCOUNTER — Inpatient Hospital Stay (HOSPITAL_COMMUNITY)
Admission: EM | Admit: 2018-06-26 | Discharge: 2018-07-30 | DRG: 207 | Disposition: E | Payer: Medicare Other | Attending: Pulmonary Disease | Admitting: Pulmonary Disease

## 2018-06-26 DIAGNOSIS — E871 Hypo-osmolality and hyponatremia: Secondary | ICD-10-CM | POA: Diagnosis not present

## 2018-06-26 DIAGNOSIS — R34 Anuria and oliguria: Secondary | ICD-10-CM | POA: Diagnosis not present

## 2018-06-26 DIAGNOSIS — I469 Cardiac arrest, cause unspecified: Secondary | ICD-10-CM

## 2018-06-26 DIAGNOSIS — Z515 Encounter for palliative care: Secondary | ICD-10-CM | POA: Diagnosis not present

## 2018-06-26 DIAGNOSIS — J9602 Acute respiratory failure with hypercapnia: Secondary | ICD-10-CM | POA: Diagnosis present

## 2018-06-26 DIAGNOSIS — T82594A Other mechanical complication of infusion catheter, initial encounter: Secondary | ICD-10-CM

## 2018-06-26 DIAGNOSIS — I472 Ventricular tachycardia, unspecified: Secondary | ICD-10-CM

## 2018-06-26 DIAGNOSIS — J189 Pneumonia, unspecified organism: Secondary | ICD-10-CM | POA: Diagnosis present

## 2018-06-26 DIAGNOSIS — D509 Iron deficiency anemia, unspecified: Secondary | ICD-10-CM | POA: Diagnosis present

## 2018-06-26 DIAGNOSIS — R402212 Coma scale, best verbal response, none, at arrival to emergency department: Secondary | ICD-10-CM | POA: Diagnosis present

## 2018-06-26 DIAGNOSIS — J9601 Acute respiratory failure with hypoxia: Secondary | ICD-10-CM | POA: Diagnosis present

## 2018-06-26 DIAGNOSIS — E876 Hypokalemia: Secondary | ICD-10-CM | POA: Diagnosis not present

## 2018-06-26 DIAGNOSIS — N179 Acute kidney failure, unspecified: Secondary | ICD-10-CM | POA: Diagnosis present

## 2018-06-26 DIAGNOSIS — R570 Cardiogenic shock: Secondary | ICD-10-CM | POA: Diagnosis present

## 2018-06-26 DIAGNOSIS — I50811 Acute right heart failure: Secondary | ICD-10-CM | POA: Diagnosis present

## 2018-06-26 DIAGNOSIS — I110 Hypertensive heart disease with heart failure: Secondary | ICD-10-CM | POA: Diagnosis present

## 2018-06-26 DIAGNOSIS — G931 Anoxic brain damage, not elsewhere classified: Secondary | ICD-10-CM | POA: Diagnosis present

## 2018-06-26 DIAGNOSIS — I248 Other forms of acute ischemic heart disease: Secondary | ICD-10-CM | POA: Diagnosis present

## 2018-06-26 DIAGNOSIS — I82442 Acute embolism and thrombosis of left tibial vein: Secondary | ICD-10-CM | POA: Diagnosis present

## 2018-06-26 DIAGNOSIS — Z8249 Family history of ischemic heart disease and other diseases of the circulatory system: Secondary | ICD-10-CM

## 2018-06-26 DIAGNOSIS — I2609 Other pulmonary embolism with acute cor pulmonale: Secondary | ICD-10-CM | POA: Diagnosis not present

## 2018-06-26 DIAGNOSIS — Z8679 Personal history of other diseases of the circulatory system: Secondary | ICD-10-CM

## 2018-06-26 DIAGNOSIS — E874 Mixed disorder of acid-base balance: Secondary | ICD-10-CM | POA: Diagnosis present

## 2018-06-26 DIAGNOSIS — Z9289 Personal history of other medical treatment: Secondary | ICD-10-CM

## 2018-06-26 DIAGNOSIS — E119 Type 2 diabetes mellitus without complications: Secondary | ICD-10-CM | POA: Diagnosis present

## 2018-06-26 DIAGNOSIS — R402312 Coma scale, best motor response, none, at arrival to emergency department: Secondary | ICD-10-CM | POA: Diagnosis present

## 2018-06-26 DIAGNOSIS — Z833 Family history of diabetes mellitus: Secondary | ICD-10-CM

## 2018-06-26 DIAGNOSIS — C61 Malignant neoplasm of prostate: Secondary | ICD-10-CM | POA: Diagnosis present

## 2018-06-26 DIAGNOSIS — I214 Non-ST elevation (NSTEMI) myocardial infarction: Secondary | ICD-10-CM | POA: Diagnosis present

## 2018-06-26 DIAGNOSIS — I2699 Other pulmonary embolism without acute cor pulmonale: Secondary | ICD-10-CM | POA: Diagnosis present

## 2018-06-26 DIAGNOSIS — Z66 Do not resuscitate: Secondary | ICD-10-CM | POA: Diagnosis not present

## 2018-06-26 DIAGNOSIS — Z8639 Personal history of other endocrine, nutritional and metabolic disease: Secondary | ICD-10-CM

## 2018-06-26 DIAGNOSIS — Z888 Allergy status to other drugs, medicaments and biological substances status: Secondary | ICD-10-CM

## 2018-06-26 DIAGNOSIS — I639 Cerebral infarction, unspecified: Secondary | ICD-10-CM | POA: Diagnosis not present

## 2018-06-26 DIAGNOSIS — I5021 Acute systolic (congestive) heart failure: Secondary | ICD-10-CM | POA: Diagnosis present

## 2018-06-26 DIAGNOSIS — H409 Unspecified glaucoma: Secondary | ICD-10-CM | POA: Diagnosis present

## 2018-06-26 DIAGNOSIS — R402112 Coma scale, eyes open, never, at arrival to emergency department: Secondary | ICD-10-CM | POA: Diagnosis present

## 2018-06-26 DIAGNOSIS — I4891 Unspecified atrial fibrillation: Secondary | ICD-10-CM

## 2018-06-26 LAB — CBC WITH DIFFERENTIAL/PLATELET
Abs Immature Granulocytes: 0.1 10*3/uL — ABNORMAL HIGH (ref 0.00–0.07)
Band Neutrophils: 2 %
Basophils Absolute: 0 10*3/uL (ref 0.0–0.1)
Basophils Relative: 0 %
Eosinophils Absolute: 0 10*3/uL (ref 0.0–0.5)
Eosinophils Relative: 0 %
HCT: 31.8 % — ABNORMAL LOW (ref 39.0–52.0)
Hemoglobin: 8.3 g/dL — ABNORMAL LOW (ref 13.0–17.0)
Lymphocytes Relative: 26 %
Lymphs Abs: 3.2 10*3/uL (ref 0.7–4.0)
MCH: 22 pg — ABNORMAL LOW (ref 26.0–34.0)
MCHC: 26.1 g/dL — ABNORMAL LOW (ref 30.0–36.0)
MCV: 84.1 fL (ref 80.0–100.0)
MONOS PCT: 2 %
Metamyelocytes Relative: 1 %
Monocytes Absolute: 0.2 10*3/uL (ref 0.1–1.0)
NEUTROS ABS: 8.7 10*3/uL — AB (ref 1.7–7.7)
NRBC: 2 /100{WBCs} — AB
Neutrophils Relative %: 69 %
Platelets: 152 10*3/uL (ref 150–400)
RBC: 3.78 MIL/uL — ABNORMAL LOW (ref 4.22–5.81)
RDW: 18.6 % — ABNORMAL HIGH (ref 11.5–15.5)
WBC: 12.2 10*3/uL — ABNORMAL HIGH (ref 4.0–10.5)
nRBC: 1.1 % — ABNORMAL HIGH (ref 0.0–0.2)

## 2018-06-26 LAB — COMPREHENSIVE METABOLIC PANEL
ALT: 38 U/L (ref 0–44)
AST: 64 U/L — ABNORMAL HIGH (ref 15–41)
Albumin: 3.5 g/dL (ref 3.5–5.0)
Alkaline Phosphatase: 100 U/L (ref 38–126)
Anion gap: 17 — ABNORMAL HIGH (ref 5–15)
BUN: 22 mg/dL (ref 8–23)
CO2: 14 mmol/L — ABNORMAL LOW (ref 22–32)
Calcium: 8.1 mg/dL — ABNORMAL LOW (ref 8.9–10.3)
Chloride: 104 mmol/L (ref 98–111)
Creatinine, Ser: 1.36 mg/dL — ABNORMAL HIGH (ref 0.61–1.24)
GFR calc Af Amer: 59 mL/min — ABNORMAL LOW (ref >60)
GFR calc non Af Amer: 51 mL/min — ABNORMAL LOW (ref >60)
Glucose, Bld: 279 mg/dL — ABNORMAL HIGH (ref 70–99)
Potassium: 3.5 mmol/L (ref 3.5–5.1)
Sodium: 135 mmol/L (ref 135–145)
Total Bilirubin: 1.3 mg/dL — ABNORMAL HIGH (ref 0.3–1.2)
Total Protein: 6.7 g/dL (ref 6.5–8.1)

## 2018-06-26 LAB — I-STAT TROPONIN, ED: Troponin i, poc: 0.07 ng/mL (ref 0.00–0.08)

## 2018-06-26 LAB — POCT I-STAT 7, (LYTES, BLD GAS, ICA,H+H)
Acid-base deficit: 12 mmol/L — ABNORMAL HIGH (ref 0.0–2.0)
Bicarbonate: 17.6 mmol/L — ABNORMAL LOW (ref 20.0–28.0)
Calcium, Ion: 1.36 mmol/L (ref 1.15–1.40)
HCT: 28 % — ABNORMAL LOW (ref 39.0–52.0)
Hemoglobin: 9.5 g/dL — ABNORMAL LOW (ref 13.0–17.0)
O2 Saturation: 89 %
POTASSIUM: 2.7 mmol/L — AB (ref 3.5–5.1)
Patient temperature: 98.6
Sodium: 137 mmol/L (ref 135–145)
TCO2: 19 mmol/L — ABNORMAL LOW (ref 22–32)
pCO2 arterial: 55.2 mmHg — ABNORMAL HIGH (ref 32.0–48.0)
pH, Arterial: 7.112 — CL (ref 7.350–7.450)
pO2, Arterial: 75 mmHg — ABNORMAL LOW (ref 83.0–108.0)

## 2018-06-26 LAB — CBG MONITORING, ED: Glucose-Capillary: 263 mg/dL — ABNORMAL HIGH (ref 70–99)

## 2018-06-26 LAB — MAGNESIUM: Magnesium: 1.8 mg/dL (ref 1.7–2.4)

## 2018-06-26 LAB — LIPASE, BLOOD: LIPASE: 30 U/L (ref 11–51)

## 2018-06-26 LAB — ACETAMINOPHEN LEVEL: Acetaminophen (Tylenol), Serum: <10 ug/mL — ABNORMAL LOW (ref 10–30)

## 2018-06-26 LAB — SALICYLATE LEVEL: Salicylate Lvl: <7 mg/dL (ref 2.8–30.0)

## 2018-06-26 LAB — BRAIN NATRIURETIC PEPTIDE: B Natriuretic Peptide: 517.7 pg/mL — ABNORMAL HIGH (ref 0.0–100.0)

## 2018-06-26 LAB — ETHANOL: Alcohol, Ethyl (B): <10 mg/dL (ref <10)

## 2018-06-26 MED ORDER — EPINEPHRINE PF 1 MG/10ML IJ SOSY
PREFILLED_SYRINGE | INTRAMUSCULAR | Status: AC | PRN
  Administered 2018-06-26: 0.2 mg via INTRAVENOUS

## 2018-06-26 MED ORDER — ETOMIDATE 2 MG/ML IV SOLN
INTRAVENOUS | Status: AC | PRN
  Administered 2018-06-26: 20 mg via INTRAVENOUS

## 2018-06-26 MED ORDER — VANCOMYCIN HCL 10 G IV SOLR
2000.0000 mg | Freq: Once | INTRAVENOUS | Status: AC
  Administered 2018-06-26: 2000 mg via INTRAVENOUS
  Filled 2018-06-26: qty 2000

## 2018-06-26 MED ORDER — IOPAMIDOL (ISOVUE-370) INJECTION 76%
INTRAVENOUS | Status: AC
  Administered 2018-06-26: 100 mL
  Filled 2018-06-26: qty 100

## 2018-06-26 MED ORDER — ROCURONIUM BROMIDE 50 MG/5ML IV SOLN
INTRAVENOUS | Status: AC | PRN
  Administered 2018-06-26: 100 mg via INTRAVENOUS

## 2018-06-26 MED ORDER — PROPOFOL 1000 MG/100ML IV EMUL
INTRAVENOUS | Status: AC
  Administered 2018-06-26: 5 ug/kg/min via INTRAVENOUS
  Filled 2018-06-26: qty 100

## 2018-06-26 MED ORDER — NOREPINEPHRINE-SODIUM CHLORIDE 4-0.9 MG/250ML-% IV SOLN
INTRAVENOUS | Status: AC
  Administered 2018-06-26: 20 ug/min
  Filled 2018-06-26: qty 250

## 2018-06-26 MED ORDER — AMIODARONE HCL IN DEXTROSE 360-4.14 MG/200ML-% IV SOLN
INTRAVENOUS | Status: AC
  Administered 2018-06-26: 60 mg/h
  Administered 2018-06-26: 22:00:00
  Filled 2018-06-26: qty 200

## 2018-06-26 MED ORDER — PIPERACILLIN-TAZOBACTAM 3.375 G IVPB 30 MIN
3.3750 g | Freq: Once | INTRAVENOUS | Status: AC
  Administered 2018-06-26: 3.375 g via INTRAVENOUS
  Filled 2018-06-26: qty 50

## 2018-06-26 NOTE — ED Triage Notes (Signed)
Pt BIB [facility] for cardiac arrest. EMS advised a friend was taking the pt to a garage to pick up his car when he started to feel bad. The friend pulled into a fire station for help. When the [facility] personnel opened the car door the pt fell out onto the ground. FD personnel assessed for pulses, none present so they initiated CPR. EMS advised the FD AED showed no shock advised. FD personnel did 10 mins of CPR until EMS arrived with 5 more performed with them on scene. FD personnel dropped a #4 king airway prior to EMS arrival. EMS personnel placed pt on their lucas device, started a 92mm IO in the left tibia, and administered 1 epi. Pt them had pulses return. Pt was then loaded into the ambulance for transport and same was initiated. While in route, EMS advised they lost pulses again and performed another 5 mins of CPR with 5 epi's administered. EMS advised they got pulses back upon arriving at the hospital. EMS advised pt had good capnography and vital signs while enroute. EMS advised they started an 18 gauge in the pt's left ac and started an EPI drip at 50mcg/min. Pulses present upon arrival.

## 2018-06-26 NOTE — H&P (Signed)
NAME:  Christian Fry, MRN:  [id number], DOB:  Oct 22, 1940, LOS: 0 ADMISSION DATE:  06/15/2018, CONSULTATION DATE:  1/27 REFERRING MD:  Dr. Dayna Barker, CHIEF COMPLAINT:  Cardiac arrest   Brief History   78 year old male admitted after cardiac arrest.   History of present illness   78 year old male with PMH as below, which is significant for DM and hypertension, presented to [name] ED 1/27 after suffering a witnessed cardiac arrest. He was reportedly in his friends car and was feeling poorly. When he started to lose consciousness his friend pulled into a fire station requesting help. Fire department personnel did not appreciate a pulse, so they initiated CPR. When EMS arrived he he was still pulseless. AED did not recommend shock. ROSC was achieved after one dose of epinephrine. He lost pulses one additional time en route to ED with ROSC after 5 minutes. Total downtime estimated at 23 mins.   He was hypotensive in the ED requiring epinephrine infusion. Laboratory evaluation significant for Glucose 279, Ca 8.1, Creatinine 1.36, BNP 517, WBC 12.2, and hemoglobin 8.3.  Family tells me that he has been having progressive SOB and lower extremity edema for several months. 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    Past Medical History  Hypertension, DM  Significant Hospital Events   1/27 > admit  Consults:    Procedures:  ETT 1/28 >  Significant Diagnostic Tests:  CTA chest 1/27 > Positive for acute PE with left upper lobe thrombus. While clot burden is small to moderate, there is CT evidence of right heart strain (RV/LV Ratio = 2.9). Near complete consolidation throughout the right lower lobe, small portion of aerated lung anteriorly. Dense consolidation in the perihilar left lower lobe with surrounding ground-glass opacities. CT head 1/27 > No acute intracranial abnormality. Echo 1/28 >  Micro Data:  Blood Culture 1/27 > Sputum 1/27 >  Antimicrobials:    Zosyn Vancomycin 1/27 in ED Ceftriaxone 1/28 > Azithromycin 1/28 >  Interim history/subjective:    Objective   Blood pressure (!) 150/77, pulse (!) 120, temperature (!) 94.1 F (34.5 C), resp. rate (!) 26, height 5\' 7"  (1.702 m), weight 100 kg, SpO2 99 %.    Vent Mode: PRVC FiO2 (%):  [100 %] 100 % Set Rate:  [18 bmp-26 bmp] 26 bmp Vt Set:  [520 mL] 520 mL PEEP:  [8 RXV40-08 cmH20] 12 cmH20 Plateau Pressure:  [20 cmH20] 20 cmH20   Intake/Output Summary (Last 24 hours) at 06/23/2018 2339 Last data filed at 06/01/2018 2149 Gross per 24 hour  Intake 500 ml  Output -  Net 500 ml   Filed Weights   06/25/2018 2309  Weight: 100 kg    Examination: General: obese male on vent HENT: [place]/AT, PERRL, no JVD Lungs: Clear bilateral breath sounds Cardiovascular: RRR, no MRG Abdomen: Soft, non-distended Extremities: No acute deformity. 2+ pitting edema to bilateral lower extremity Neuro: Coma   Resolved Hospital Problem list     Assessment & Plan:   Cardiac arrest: He was a PEA/asystolic arrest for his first 3 periods of arrest, then had once short arrest of VT requiring cardioversion and amiodarone. Etiology not entirely clear, however he does have a LUL PE with increased RV/LV ratio. Sounds like he has been developing CHF as well over the past few months. He was in shock upon arrival to ED, but has since been weaned off epinephrine and is hypertensive.  - Admit  to ICU - Initiate TTM 33C  - Echocardiogram - Start heparin infusion - Continue amiodarone - Repeat ECG in AM - Echocardiogram - Holding home HCTZ  Acute hypoxemic/hypercarbic respiratory failure - Full vent support - ABG - VAP bundle  Pulmonary embolism: LUL clot. RV/LV ratio 2.9. At least some of this is probably chronic as he remains hemodynamically stable off pressors at this time. - Heparin as above - Echo - Doppler legs.   CAP vs aspiration: Located RLL - Narrow antibiotics to ceftriaxone, azithromycin -  Follow cultures, WBC, fever curve - Check RVP, urine strep and legionella.   AKI - Recheck BMP now that more hemodynamically stable.   DM - SSI - Hold home metformin  Electrolyte abnormalities: Hypocalcemia - Supplement Ca  Metabolic and Respiratory mixed acidosis - Vent settings adjusted.  - Repeat ABG in one hour.    Best practice:  Diet: NPO Pain/Anxiety/Delirium protocol (if indicated): Propofol, fentanyl VAP protocol (if indicated): Propofol and fentanyl for RASS goal -2 to -3 DVT prophylaxis: Heparin infusion GI prophylaxis: PPI Glucose control: SSI Mobility: BR Code Status: FULL Family Communication: Nephew and cousin (closest living relatives) were updated bedside in the ED Disposition: ICU- critical condition.  Labs   CBC: Recent Labs  Lab 06/06/2018 2214 06/09/2018 2235  WBC 12.2*  --   NEUTROABS 8.7*  --   HGB 8.3* 9.5*  HCT 31.8* 28.0*  MCV 84.1  --   PLT 152  --     Basic Metabolic Panel: Recent Labs  Lab 06/17/2018 2214 06/12/2018 2235  NA 135 137  K 3.5 2.7*  CL 104  --   CO2 14*  --   GLUCOSE 279*  --   BUN 22  --   CREATININE 1.36*  --   CALCIUM 8.1*  --   MG 1.8  --    GFR: Estimated Creatinine Clearance: 52.9 mL/min (A) (by C-G formula based on SCr of 1.36 mg/dL (H)). Recent Labs  Lab 06/25/2018 2214  WBC 12.2*    Liver Function Tests: Recent Labs  Lab 06/18/2018 2214  AST 64*  ALT 38  ALKPHOS 100  BILITOT 1.3*  PROT 6.7  ALBUMIN 3.5   Recent Labs  Lab 06/25/2018 2214  LIPASE 30   No results for input(s): AMMONIA in the last 168 hours.  ABG    Component Value Date/Time   PHART 7.112 (LL) 06/20/2018 2235   PCO2ART 55.2 (H) 06/28/2018 2235   PO2ART 75.0 (L) 06/18/2018 2235   HCO3 17.6 (L) 06/13/2018 2235   TCO2 19 (L) 06/20/2018 2235   ACIDBASEDEF 12.0 (H) 06/22/2018 2235   O2SAT 89.0 06/13/2018 2235     Coagulation Profile: No results for input(s): INR, PROTIME in the last 168 hours.  Cardiac Enzymes: No results  for input(s): CKTOTAL, CKMB, CKMBINDEX, TROPONINI in the last 168 hours.  HbA1C: No results found for: HGBA1C  CBG: Recent Labs  Lab 06/13/2018 2306  GLUCAP 263*    Review of Systems:   Unable as patient is unresponsive  Past Medical History  He,  has a past medical history of Diabetes mellitus without complication ([place]), Glaucoma, Hypertension, and Prostate cancer ([place]).   Surgical History    Past Surgical History:  Procedure Laterality Date  . PROSTATE BIOPSY    . TONSILECTOMY/ADENOIDECTOMY WITH MYRINGOTOMY       Social History   reports that he has never smoked. He has never used smokeless tobacco. He reports that he does not drink alcohol or use drugs.  Family History   His family history includes Diabetes in his mother; Heart attack in his father.   Allergies No Known Allergies   Home Medications  Prior to Admission medications   Medication Sig Start Date End Date Taking? Authorizing Provider  Canagliflozin (INVOKANA PO) Take by mouth.    [provider]  Cholecalciferol (VITAMIN D PO) Take by mouth.    [provider]  hydrochlorothiazide (HYDRODIURIL) 25 MG tablet Take 25 mg by mouth daily.    [provider]  metFORMIN (GLUCOPHAGE) 1000 MG tablet Take 1,000 mg by mouth 2 (two) times daily with a meal.    [provider]  ofloxacin (OCUFLOX) 0.3 % ophthalmic solution 1 drop 4 (four) times daily.    [provider]     Critical care time: 40 mins     Georgann Housekeeper, AGACNP-BC [place] Pager [phone] or [phone]  06/27/2018 12:38 AM

## 2018-06-26 NOTE — ED Provider Notes (Addendum)
[facility]GENCY DEPARTMENT Provider Note   CSN: [id number] Arrival date & time: 06/21/2018  2148   History   Chief Complaint Chief Complaint  Patient presents with  . Cardiac Arrest    HPI Christian Fry is a 78 y.o. male.  HPI   Christian Fry is a 78 y.o. male with PMH of diabetes, glaucoma, hypertension, prostate cancer who presents with EMS from the scene of a witnessed cardiac arrest.  He reportedly had been complaining of short of breath and had a friend pick him up from a garage where his car was being worked on.  While driving home he became much more short of breath and they pulled over to fire department.  When fire poured amount of the car he had a witnessed arrest and underwent 8 minutes of CPR.  Non-shockable rhythm on AED at that time with Roscoe achieved.  EMS arrived and patient later had additional 10 to 12 minutes of CPR per report and received 1 mg of IV epinephrine and started on epinephrine infusion with Roscoe.  Lucas device used prior to arrival.  Herlong airway placed by EMS.  Past Medical History:  Diagnosis Date  . Diabetes mellitus without complication ([place])   . Glaucoma   . Hypertension   . Prostate cancer [facility])     Patient Active Problem List   Diagnosis Date Noted  . Cardiac arrest ([place]) 06/27/2018  . Malignant neoplasm of prostate ([place]) 12/25/2015    Past Surgical History:  Procedure Laterality Date  . PROSTATE BIOPSY    . TONSILECTOMY/ADENOIDECTOMY WITH MYRINGOTOMY          Home Medications    Prior to Admission medications   Medication Sig Start Date End Date Taking? Authorizing Provider  Canagliflozin (INVOKANA PO) Take by mouth.    [provider]  Cholecalciferol (VITAMIN D PO) Take by mouth.    [provider]  hydrochlorothiazide (HYDRODIURIL) 25 MG tablet Take 25 mg by mouth daily.    [provider]  metFORMIN (GLUCOPHAGE) 1000 MG tablet Take 1,000 mg by mouth 2 (two) times daily with  a meal.    [provider]  ofloxacin (OCUFLOX) 0.3 % ophthalmic solution 1 drop 4 (four) times daily.    [provider]    Family History Family History  Problem Relation Age of Onset  . Diabetes Mother   . Heart attack Father     Social History Social History   Tobacco Use  . Smoking status: Never Smoker  . Smokeless tobacco: Never Used  Substance Use Topics  . Alcohol use: No  . Drug use: No     Allergies   Patient has no known allergies.   Review of Systems Review of Systems  Unable to perform ROS: Mental status change  Respiratory: Positive for shortness of breath.   Cardiovascular: Positive for leg swelling.     Physical Exam Updated Vital Signs BP 140/84   Pulse (!) 109   Temp (!) 93.6 F (34.2 C)   Resp (!) 24   Ht _0  (1.702 m)   Wt 100 kg   SpO2 91%   BMI 34.53 kg/m   Physical Exam Vitals signs and nursing note reviewed.  Constitutional:      General: He is in acute distress.     Appearance: He is well-developed. He is ill-appearing.     Interventions: He is intubated (king). Cervical collar and backboard in place.     Comments: King airway in  place.  HENT:     Head: Normocephalic and atraumatic.     Jaw: There is normal jaw occlusion.  Eyes:     General: Lids are normal.     Conjunctiva/sclera: Conjunctivae normal.     Pupils: Pupils are equal, round, and reactive to light.  Neck:     Musculoskeletal: Neck supple.  Cardiovascular:     Rate and Rhythm: Tachycardia present. Rhythm irregularly irregular.     Pulses:          Radial pulses are 2+ on the right side and 2+ on the left side.     Heart sounds: No murmur.  Pulmonary:     Effort: Bradypnea present. He is intubated (king).     Breath sounds: Examination of the right-upper field reveals rhonchi. Examination of the left-upper field reveals rhonchi. Examination of the right-middle field reveals rhonchi. Examination of the left-middle field reveals rhonchi.  Examination of the right-lower field reveals decreased breath sounds. Examination of the left-lower field reveals decreased breath sounds. Decreased breath sounds and rhonchi present.  Abdominal:     General: There is no distension.     Palpations: Abdomen is soft.  Musculoskeletal:     Right lower leg: Edema present.     Left lower leg: Edema present.     Comments: 2+ pitting bilaterally.  Skin:    General: Skin is warm and dry.      ED Treatments / Results  Labs (all labs ordered are listed, but only abnormal results are displayed) Labs Reviewed  CBC WITH DIFFERENTIAL/PLATELET - Abnormal; Notable for the following components:      Result Value   WBC 12.2 (*)    RBC 3.78 (*)    Hemoglobin 8.3 (*)    HCT 31.8 (*)    MCH 22.0 (*)    MCHC 26.1 (*)    RDW 18.6 (*)    nRBC 1.1 (*)    Neutro Abs 8.7 (*)    nRBC 2 (*)    Abs Immature Granulocytes 0.10 (*)    All other components within normal limits  COMPREHENSIVE METABOLIC PANEL - Abnormal; Notable for the following components:   CO2 14 (*)    Glucose, Bld 279 (*)    Creatinine, Ser 1.36 (*)    Calcium 8.1 (*)    AST 64 (*)    Total Bilirubin 1.3 (*)    GFR calc non Af Amer 51 (*)    GFR calc Af Amer 59 (*)    Anion gap 17 (*)    All other components within normal limits  BRAIN NATRIURETIC PEPTIDE - Abnormal; Notable for the following components:   B Natriuretic Peptide 517.7 (*)    All other components within normal limits  ACETAMINOPHEN LEVEL - Abnormal; Notable for the following components:   Acetaminophen (Tylenol), Serum <10 (*)    All other components within normal limits  LACTIC ACID, PLASMA - Abnormal; Notable for the following components:   Lactic Acid, Venous 6.1 (*)    All other components within normal limits  CBG MONITORING, ED - Abnormal; Notable for the following components:   Glucose-Capillary 263 (*)    All other components within normal limits  POCT I-STAT 7, (LYTES, BLD GAS, ICA,H+H) - Abnormal;  Notable for the following components:   pH, Arterial 7.112 (*)    pCO2 arterial 55.2 (*)    pO2, Arterial 75.0 (*)    Bicarbonate 17.6 (*)    TCO2 19 (*)    Acid-base  deficit 12.0 (*)    Potassium 2.7 (*)    HCT 28.0 (*)    Hemoglobin 9.5 (*)    All other components within normal limits  POCT I-STAT 7, (LYTES, BLD GAS, ICA,H+H) - Abnormal; Notable for the following components:   pH, Arterial 7.349 (*)    pCO2 arterial 30.9 (*)    pO2, Arterial 58.0 (*)    Bicarbonate 17.6 (*)    TCO2 19 (*)    Acid-base deficit 8.0 (*)    Potassium 3.0 (*)    HCT 28.0 (*)    Hemoglobin 9.5 (*)    All other components within normal limits  CULTURE, BLOOD (ROUTINE X 2)  CULTURE, BLOOD (ROUTINE X 2)  CULTURE, RESPIRATORY  RESPIRATORY PANEL BY PCR  MAGNESIUM  LIPASE, BLOOD  SALICYLATE LEVEL  ETHANOL  LACTIC ACID, PLASMA  TROPONIN I  TROPONIN I  TROPONIN I  TROPONIN I  BASIC METABOLIC PANEL  PROTIME-INR  PROTIME-INR  APTT  APTT  BLOOD GAS, ARTERIAL  BLOOD GAS, ARTERIAL  CBC  BLOOD GAS, ARTERIAL  MAGNESIUM  PHOSPHORUS  STREP PNEUMONIAE URINARY ANTIGEN  HEPARIN LEVEL (UNFRACTIONATED)  I-STAT TROPONIN, ED  I-STAT ARTERIAL BLOOD GAS, ED  I-STAT TROPONIN, ED    EKG None  Radiology Ct Head Wo Contrast  Result Date: 06/12/2018 CLINICAL DATA:  Unresponsive EXAM: CT HEAD WITHOUT CONTRAST TECHNIQUE: Contiguous axial images were obtained from the base of the skull through the vertex without intravenous contrast. COMPARISON:  None. FINDINGS: Brain: No acute intracranial abnormality. Specifically, no hemorrhage, hydrocephalus, mass lesion, acute infarction, or significant intracranial injury. Vascular: No hyperdense vessel or unexpected calcification. Skull: No acute calvarial abnormality. Sinuses/Orbits: No acute findings Other: None IMPRESSION: No acute intracranial abnormality. Electronically Signed   By: Rolm Baptise M.D.   On: 06/28/2018 23:56   Ct Angio Chest Pe W And/or Wo  Contrast  Result Date: 06/27/2018 CLINICAL DATA:  Cardiac arrest. EXAM: CT ANGIOGRAPHY CHEST WITH CONTRAST TECHNIQUE: Multidetector CT imaging of the chest was performed using the standard protocol during bolus administration of intravenous contrast. Multiplanar CT image reconstructions and MIPs were obtained to evaluate the vascular anatomy. CONTRAST:  144m ISOVUE-370 IOPAMIDOL (ISOVUE-370) INJECTION 76% COMPARISON:  Chest radiograph earlier this day. FINDINGS: Cardiovascular: Positive for acute PE with filling defect in the left upper lobe pulmonary artery. Clot extends into the segmental branches. No other pulmonary artery filling defects. There is right heart strain with RV to LV ratio of 2.9. Coronary artery calcifications versus stents. Aortic atherosclerosis without evidence of dissection. No pericardial effusion. Mediastinum/Nodes: Endotracheal tube tip above the carina. Enteric tube tip below the diaphragm. No mediastinal or evidence of hilar adenopathy, left hilar pulmonary opacity limits detailed assessment. No visualized thyroid nodule. Lungs/Pleura: Near complete consolidation throughout the right lower lobe, small portion of aerated lung anteriorly demonstrates ground-glass opacities. Dense consolidation in the perihilar left lower lobe with surrounding ground-glass opacities. Lesser ground-glass opacities in the perihilar left upper lobe. Moderate right and small left pleural effusion, partially loculated on the right. No pneumothorax. No septal thickening. Minimal mucus distal to the endotracheal tube, the trachea and mainstem bronchi are otherwise clear. Upper Abdomen: No acute upper abdominal findings. 9 mm hypodensity in the left lobe of the liver is incompletely characterized. Musculoskeletal: Nondisplaced sternal fracture. No definite acute rib fractures with special attention to the anterior ribs. No focal osseous lesion. Review of the MIP images confirms the above findings. IMPRESSION: 1.  Positive for acute PE with left upper lobe thrombus.  While clot burden is small to moderate, there is CT evidence of right heart strain (RV/LV Ratio = 2.9) consistent with at least submassive (intermediate risk) PE. The presence of right heart strain has been associated with an increased risk of morbidity and mortality. Please activate Code PE by paging [phone]. 2. Near complete consolidation throughout the right lower lobe, small portion of aerated lung anteriorly. Dense consolidation in the perihilar left lower lobe with surrounding ground-glass opacities. Findings likely due to multifocal pneumonia, lack of significant debris in the airway favors against aspiration. Recommend continued radiographic follow-up to resolution to exclude underlying pulmonary mass. 3. Moderate right and small left pleural effusion, partially loculated on the right. 4. Nondisplaced sternal fracture. 5. Non-specific subcentimeter low-density lesion in the liver. Aortic Atherosclerosis (ICD10-I70.0). Critical Value/emergent results were called by telephone at the time of interpretation on 06/27/2018 at 12:12 am to Dr. Wyvonnia Dusky , who verbally acknowledged these results. Electronically Signed   By: Keith Rake M.D.   On: 06/27/2018 00:13   Dg Chest Portable 1 View  Result Date: 06/07/2018 CLINICAL DATA:  Tube placement EXAM: PORTABLE CHEST 1 VIEW COMPARISON:  None. FINDINGS: Endotracheal tube is 4 cm above the carina. NG tube enters the stomach. Cardiomegaly. Diffuse airspace disease throughout the right lung. Small to moderate right pleural effusion. Mild perihilar opacity on the left. Findings could reflect asymmetric edema or infection. IMPRESSION: Endotracheal tube 4 cm above the carina. Asymmetric airspace disease, right worse than left with right effusion. Findings could reflect asymmetric edema or infection. Electronically Signed   By: Rolm Baptise M.D.   On: 06/21/2018 22:43   Dg Abd Portable 1 View  Result Date:  06/29/2018 CLINICAL DATA:  Gastric tube placement EXAM: PORTABLE ABDOMEN - 1 VIEW COMPARISON:  None. FINDINGS: The tip and side port of a gastric tube are noted in the left upper quadrant of the abdomen in the expected location the stomach. Moderate stool retention is seen within the colon. There is thoracolumbar spondylosis. Surgical clips project over the pubic symphysis. IMPRESSION: Gastric tube is noted in satisfactory position within the left upper quadrant of the abdomen in the expected location of the stomach. Electronically Signed   By: Ashley Royalty M.D.   On: 06/19/2018 22:45    Procedures Procedure Name: Intubation Date/Time: 06/27/2018 1:17 AM Performed by: Louellen Molder, MD Pre-anesthesia Checklist: Suction available Oxygen Delivery Method: Ambu bag Preoxygenation: Pre-oxygenation with 100% oxygen Induction Type: Rapid sequence Laryngoscope Size: Mac and 4 Grade View: Grade I Tube type: Subglottic suction tube Tube size: 7.5 mm Number of attempts: 1 Placement Confirmation: ETT inserted through vocal cords under direct vision,  Positive ETCO2,  CO2 detector and Breath sounds checked- equal and bilateral Secured at: 24 cm Tube secured with: ETT holder      (including critical care time)  Medications Ordered in ED Medications  vancomycin (VANCOCIN) 2,000 mg in sodium chloride 0.9 % 500 mL IVPB (2,000 mg Intravenous New Bag/Given 06/12/2018 2329)  norepinephrine (LEVOPHED) 27m in NS 2555mpremix infusion (has no administration in time range)  cisatracurium (NIMBEX) bolus via infusion 10 mg (has no administration in time range)    And  cisatracurium (NIMBEX) 200 mg in sodium chloride 0.9 % 200 mL (1 mg/mL) infusion (has no administration in time range)    And  cisatracurium (NIMBEX) bolus via infusion 5 mg (has no administration in time range)  artificial tears (LACRILUBE) ophthalmic ointment 1 application (has no administration in time range)  0.9 %  sodium chloride  infusion (has no administration in time range)  fentaNYL (SUBLIMAZE) injection 50 mcg (has no administration in time range)  fentaNYL 2536mg in NS 2529m(1063mml) infusion-PREMIX (has no administration in time range)  fentaNYL (SUBLIMAZE) bolus via infusion 25 mcg (has no administration in time range)  propofol (DIPRIVAN) 1000 MG/100ML infusion (has no administration in time range)  pantoprazole (PROTONIX) injection 40 mg (has no administration in time range)  cefTRIAXone (ROCEPHIN) 1 g in sodium chloride 0.9 % 100 mL IVPB (has no administration in time range)  azithromycin (ZITHROMAX) 500 mg in sodium chloride 0.9 % 250 mL IVPB (has no administration in time range)  insulin aspart (novoLOG) injection 3-9 Units (has no administration in time range)  calcium gluconate 1 g/ 50 mL sodium chloride IVPB (has no administration in time range)  heparin bolus via infusion 2,000 Units (has no administration in time range)  heparin ADULT infusion 100 units/mL (25000 units/250m61mdium chloride 0.45%) (has no administration in time range)  amiodarone (NEXTERONE PREMIX) 360-4.14 MG/200ML-% (1.8 mg/mL) IV infusion (60 mg/hr  New Bag/Given 06/19/2018 2234)  propofol (DIPRIVAN) 1000 MG/100ML infusion (  Stopped 06/25/2018 2230)  EPINEPHrine (ADRENALIN) 1 MG/10ML injection (0.2 mg Intravenous Given 06/04/2018 2201)  etomidate (AMIDATE) injection (20 mg Intravenous Given 06/25/2018 2202)  rocuronium (ZEMURON) injection (100 mg Intravenous Given 06/20/2018 2203)  iopamidol (ISOVUE-370) 76 % injection (100 mLs  Contrast Given 06/24/2018 2230)  norepinephrine (LEVOPHED) 4-0.9 MG/250ML-% infusion SOLN (8 mcg/min  Rate/Dose Change 06/27/18 0033)  piperacillin-tazobactam (ZOSYN) IVPB 3.375 g (0 g Intravenous Stopped 06/27/18 0008)  EPINEPHrine (ADRENALIN) 1 MG/10ML injection (1 mg Intravenous Given 06/06/2018 2218)  sodium bicarbonate injection (50 mEq Intravenous Given 06/23/2018 2220)  calcium chloride injection (1 g Intravenous Given  05/31/2018 2221)     Initial Impression / Assessment and Plan / ED Course  I have reviewed the triage vital signs and the nursing notes.  Pertinent labs & imaging results that were available during my care of the patient were reviewed by me and considered in my medical decision making (see chart for details).     MDM:  Imaging: Chest x-ray showed appropriate positioning of ET tube.  Right worse than left effusions.  PE study shows a PE in the left upper lobe thrombus.  Radiology report specifically notes that while clot burden is small to moderate there is evidence for right heart strain consistent with at least submassive PE.  Near complete consolidation throughout the right lower lobe, small portion of aerated lung anteriorly.  Dense consolidation perihilar left lower lobe TVI and ground glass opacity surrounding.  Likely multifocal pneumonia.  Moderate right and small left pleural effusion.  Nondisplaced renal fracture.  Nonspecific subcentimeter low-density lesion in the liver CT head shows no acute intracranial abnormality.  ED Provider Interpretation of EKG: A. fib with a rate of 80 bpm, normal axis (borderline isoelectric in aVF), low voltage QRS, poor R wave progression, no significant ST segment elevation or depression, small amount of depression in V6.  QTC 487 and QRS 89.  Labs: BNP 517, lipase 30, mag 1.8, CMP with a CO2 of 14 and anion gap 17, creatinine 1.36 otherwise unremarkable, Tylenol negative, salicylate negative, alcohol negative, troponin 0.7, ABG 7.1 1/55/75/17, glucose 263 and lactate 6.1, repeat gas 7.3 09/27/56/17  On initial evaluation, patient appears unresponsive.  King airway in place.. Afebrile, tachycardic with irregularly irregular heart rate in the 130s to 140s.  Hypertensive greater than 200 299tolic and then down trended to the  382N systolic arrived on an epi infusion by EMS.  Breathing intermittently at about 4-6 times per minute.  Presents after witnessed arrest at  fire department as detailed above.  After patient's family arrived they noted that the patient has had swelling in his lower extremities, worse on the left than the right for several days.  Had difficulty putting his shoes on.  On exam, patient is not moving and intermittently breathing.  King airway maintained with equal breath sounds bilaterally and no hypoxia on arrival.  Bagging easily.  IV access obtained and epinephrine infusion continued.  It was uptitrated as patient's blood pressure precipitously dropped and his heart rate decreased into the 60s to 70s.  Concern for peri-arrest.  Given 20 mcg IV push epinephrine and infusion increased.  Thereafter blood pressure improved and he was given IV fluid bolus.  Intubated with etomidate and rocuronium according to procedure note above.  Adequate tube placement on chest x-ray.  Bedside ultrasound showed right atrial dilation and borderline RV dilation with no septal bowing.  No significant pericardial effusion.  Right pleural effusion noted.  Shortly thereafter, patient suffered hypoxia and bradycardia with PEA arrest.  Compressions restarted and he was manually bagged by RT during that time for about 3 minutes.  Given 1 mg of IV epinephrine, 1 g of calcium chloride, and 1 amp of sodium bicarb push.  Rosc achieved.  Increased runs of NSVT followed by sustained ventricular tachycardia with a pulse.  Synchronized cardioversion performed at 200 J x 2 with persistent V. tach with a pulse.  150 mg IV amiodarone bolus given with subsequent conversion to narrow complex A. fib with RVR.  1 mg/min infusion started thereafter for 6 hours.  Concern for PE versus flash pulmonary edema with hypoxic respiratory failure.  Also concern for pneumonia given vancomycin and Zosyn.  PE study performed showing left lobar PE and evidence for RV strain, however, he has global cardiac dysfunction and has a history of hypertension.  Unclear if this PE is large enough to cause arrest.   Critical care placed orders for cooling.  After return of circulation following the above PEA arrest his epinephrine was down titrated and Levophed was started.  Eventually this was down titrated from 22 8 at time of transfer of care as patient's blood pressure remained in the 140s over 80s but he had had some mild hypoxia.  Concern for increased pulmonary pressures with Levophed.  SPO2 increased to 92-93 thereafter.  Nimbex and heparin ordered by critical care team.  Remain in the ED awaiting inpatient bed at time of transfer of care to overnight team.  Family updated by attending physician.  The plan for this patient was discussed with Dr. Dayna Barker who voiced agreement and who oversaw evaluation and treatment of this patient.   Final Clinical Impressions(s) / ED Diagnoses   Final diagnoses:  Cardiac arrest ([place])  Acute respiratory failure with hypoxemia [facility])  Cardiac arrest with pulseless electrical activity ([place])  Atrial fibrillation with RVR ([facility])  Ventricular tachycardia ([place])  Acute pulmonary embolism with acute cor pulmonale, unspecified pulmonary embolism type [facility])    ED Discharge Orders    None       Redell Nazir, Rodena Goldmann, MD 06/27/18 0116    Dominiqua Cooner, Rodena Goldmann, MD 06/27/18 0118    Gopal Malter, Rodena Goldmann, MD 06/27/18 0121    Merrily Pew, MD 06/28/18 0201

## 2018-06-27 ENCOUNTER — Encounter (HOSPITAL_COMMUNITY): Payer: Self-pay

## 2018-06-27 ENCOUNTER — Inpatient Hospital Stay (HOSPITAL_COMMUNITY): Payer: Medicare Other

## 2018-06-27 ENCOUNTER — Other Ambulatory Visit: Payer: Self-pay

## 2018-06-27 DIAGNOSIS — R402212 Coma scale, best verbal response, none, at arrival to emergency department: Secondary | ICD-10-CM | POA: Diagnosis present

## 2018-06-27 DIAGNOSIS — R570 Cardiogenic shock: Secondary | ICD-10-CM | POA: Diagnosis not present

## 2018-06-27 DIAGNOSIS — J9601 Acute respiratory failure with hypoxia: Secondary | ICD-10-CM

## 2018-06-27 DIAGNOSIS — Z8249 Family history of ischemic heart disease and other diseases of the circulatory system: Secondary | ICD-10-CM | POA: Diagnosis not present

## 2018-06-27 DIAGNOSIS — H409 Unspecified glaucoma: Secondary | ICD-10-CM | POA: Diagnosis present

## 2018-06-27 DIAGNOSIS — E871 Hypo-osmolality and hyponatremia: Secondary | ICD-10-CM | POA: Diagnosis not present

## 2018-06-27 DIAGNOSIS — I248 Other forms of acute ischemic heart disease: Secondary | ICD-10-CM | POA: Diagnosis present

## 2018-06-27 DIAGNOSIS — I2609 Other pulmonary embolism with acute cor pulmonale: Principal | ICD-10-CM

## 2018-06-27 DIAGNOSIS — C61 Malignant neoplasm of prostate: Secondary | ICD-10-CM | POA: Diagnosis present

## 2018-06-27 DIAGNOSIS — J90 Pleural effusion, not elsewhere classified: Secondary | ICD-10-CM

## 2018-06-27 DIAGNOSIS — E874 Mixed disorder of acid-base balance: Secondary | ICD-10-CM | POA: Diagnosis present

## 2018-06-27 DIAGNOSIS — I469 Cardiac arrest, cause unspecified: Secondary | ICD-10-CM | POA: Diagnosis present

## 2018-06-27 DIAGNOSIS — I214 Non-ST elevation (NSTEMI) myocardial infarction: Secondary | ICD-10-CM | POA: Diagnosis present

## 2018-06-27 DIAGNOSIS — I4891 Unspecified atrial fibrillation: Secondary | ICD-10-CM | POA: Diagnosis present

## 2018-06-27 DIAGNOSIS — N179 Acute kidney failure, unspecified: Secondary | ICD-10-CM | POA: Diagnosis present

## 2018-06-27 DIAGNOSIS — E119 Type 2 diabetes mellitus without complications: Secondary | ICD-10-CM | POA: Diagnosis present

## 2018-06-27 DIAGNOSIS — I639 Cerebral infarction, unspecified: Secondary | ICD-10-CM | POA: Diagnosis not present

## 2018-06-27 DIAGNOSIS — I472 Ventricular tachycardia: Secondary | ICD-10-CM

## 2018-06-27 DIAGNOSIS — I2699 Other pulmonary embolism without acute cor pulmonale: Secondary | ICD-10-CM | POA: Diagnosis not present

## 2018-06-27 DIAGNOSIS — Z7189 Other specified counseling: Secondary | ICD-10-CM | POA: Diagnosis not present

## 2018-06-27 DIAGNOSIS — I5021 Acute systolic (congestive) heart failure: Secondary | ICD-10-CM | POA: Diagnosis present

## 2018-06-27 DIAGNOSIS — R402112 Coma scale, eyes open, never, at arrival to emergency department: Secondary | ICD-10-CM | POA: Diagnosis present

## 2018-06-27 DIAGNOSIS — I110 Hypertensive heart disease with heart failure: Secondary | ICD-10-CM | POA: Diagnosis present

## 2018-06-27 DIAGNOSIS — J9602 Acute respiratory failure with hypercapnia: Secondary | ICD-10-CM | POA: Diagnosis present

## 2018-06-27 DIAGNOSIS — Z833 Family history of diabetes mellitus: Secondary | ICD-10-CM | POA: Diagnosis not present

## 2018-06-27 DIAGNOSIS — R402312 Coma scale, best motor response, none, at arrival to emergency department: Secondary | ICD-10-CM | POA: Diagnosis present

## 2018-06-27 DIAGNOSIS — G931 Anoxic brain damage, not elsewhere classified: Secondary | ICD-10-CM | POA: Diagnosis present

## 2018-06-27 DIAGNOSIS — I82442 Acute embolism and thrombosis of left tibial vein: Secondary | ICD-10-CM | POA: Diagnosis present

## 2018-06-27 DIAGNOSIS — J189 Pneumonia, unspecified organism: Secondary | ICD-10-CM | POA: Diagnosis present

## 2018-06-27 LAB — BASIC METABOLIC PANEL
Anion gap: 18 — ABNORMAL HIGH (ref 5–15)
Anion gap: 15 (ref 5–15)
Anion gap: 13 (ref 5–15)
Anion gap: 11 (ref 5–15)
Anion gap: 12 (ref 5–15)
BUN: 23 mg/dL (ref 8–23)
BUN: 23 mg/dL (ref 8–23)
BUN: 24 mg/dL — ABNORMAL HIGH (ref 8–23)
BUN: 23 mg/dL (ref 8–23)
BUN: 23 mg/dL (ref 8–23)
CALCIUM: 8.4 mg/dL — AB (ref 8.9–10.3)
CO2: 15 mmol/L — ABNORMAL LOW (ref 22–32)
CO2: 17 mmol/L — ABNORMAL LOW (ref 22–32)
CO2: 17 mmol/L — ABNORMAL LOW (ref 22–32)
CO2: 17 mmol/L — ABNORMAL LOW (ref 22–32)
CO2: 16 mmol/L — ABNORMAL LOW (ref 22–32)
CREATININE: 1.38 mg/dL — AB (ref 0.61–1.24)
Calcium: 8.3 mg/dL — ABNORMAL LOW (ref 8.9–10.3)
Calcium: 7.8 mg/dL — ABNORMAL LOW (ref 8.9–10.3)
Calcium: 8.1 mg/dL — ABNORMAL LOW (ref 8.9–10.3)
Calcium: 8 mg/dL — ABNORMAL LOW (ref 8.9–10.3)
Chloride: 102 mmol/L (ref 98–111)
Chloride: 101 mmol/L (ref 98–111)
Chloride: 105 mmol/L (ref 98–111)
Chloride: 106 mmol/L (ref 98–111)
Chloride: 106 mmol/L (ref 98–111)
Creatinine, Ser: 1.3 mg/dL — ABNORMAL HIGH (ref 0.61–1.24)
Creatinine, Ser: 1.23 mg/dL (ref 0.61–1.24)
Creatinine, Ser: 1.28 mg/dL — ABNORMAL HIGH (ref 0.61–1.24)
Creatinine, Ser: 1.37 mg/dL — ABNORMAL HIGH (ref 0.61–1.24)
GFR calc Af Amer: >60 mL/min (ref >60)
GFR calc Af Amer: >60 mL/min (ref >60)
GFR calc Af Amer: >60 mL/min (ref >60)
GFR calc Af Amer: 58 mL/min — ABNORMAL LOW (ref >60)
GFR calc Af Amer: 58 mL/min — ABNORMAL LOW (ref >60)
GFR calc non Af Amer: 53 mL/min — ABNORMAL LOW (ref >60)
GFR calc non Af Amer: 57 mL/min — ABNORMAL LOW (ref >60)
GFR calc non Af Amer: 54 mL/min — ABNORMAL LOW (ref >60)
GFR calc non Af Amer: 50 mL/min — ABNORMAL LOW (ref >60)
GFR calc non Af Amer: 50 mL/min — ABNORMAL LOW (ref >60)
GLUCOSE: 116 mg/dL — AB (ref 70–99)
GLUCOSE: 204 mg/dL — AB (ref 70–99)
GLUCOSE: 250 mg/dL — AB (ref 70–99)
Glucose, Bld: 187 mg/dL — ABNORMAL HIGH (ref 70–99)
Glucose, Bld: 114 mg/dL — ABNORMAL HIGH (ref 70–99)
Potassium: 3 mmol/L — ABNORMAL LOW (ref 3.5–5.1)
Potassium: 3 mmol/L — ABNORMAL LOW (ref 3.5–5.1)
Potassium: 3 mmol/L — ABNORMAL LOW (ref 3.5–5.1)
Potassium: 3.2 mmol/L — ABNORMAL LOW (ref 3.5–5.1)
Potassium: 3.3 mmol/L — ABNORMAL LOW (ref 3.5–5.1)
SODIUM: 135 mmol/L (ref 135–145)
Sodium: 135 mmol/L (ref 135–145)
Sodium: 133 mmol/L — ABNORMAL LOW (ref 135–145)
Sodium: 134 mmol/L — ABNORMAL LOW (ref 135–145)
Sodium: 134 mmol/L — ABNORMAL LOW (ref 135–145)

## 2018-06-27 LAB — POCT I-STAT 7, (LYTES, BLD GAS, ICA,H+H)
Acid-base deficit: 8 mmol/L — ABNORMAL HIGH (ref 0.0–2.0)
Bicarbonate: 17.6 mmol/L — ABNORMAL LOW (ref 20.0–28.0)
Calcium, Ion: 1.16 mmol/L (ref 1.15–1.40)
HCT: 28 % — ABNORMAL LOW (ref 39.0–52.0)
Hemoglobin: 9.5 g/dL — ABNORMAL LOW (ref 13.0–17.0)
O2 Saturation: 92 %
Patient temperature: 34.2
Potassium: 3 mmol/L — ABNORMAL LOW (ref 3.5–5.1)
Sodium: 136 mmol/L (ref 135–145)
TCO2: 19 mmol/L — ABNORMAL LOW (ref 22–32)
pCO2 arterial: 30.9 mmHg — ABNORMAL LOW (ref 32.0–48.0)
pH, Arterial: 7.349 — ABNORMAL LOW (ref 7.350–7.450)
pO2, Arterial: 58 mmHg — ABNORMAL LOW (ref 83.0–108.0)

## 2018-06-27 LAB — TROPONIN I
TROPONIN I: 0.2 ng/mL — AB (ref <0.03)
Troponin I: 0.07 ng/mL (ref <0.03)
Troponin I: 0.23 ng/mL (ref <0.03)
Troponin I: 0.23 ng/mL (ref <0.03)

## 2018-06-27 LAB — CBC
HCT: 27.2 % — ABNORMAL LOW (ref 39.0–52.0)
Hemoglobin: 7.7 g/dL — ABNORMAL LOW (ref 13.0–17.0)
MCH: 21.7 pg — ABNORMAL LOW (ref 26.0–34.0)
MCHC: 28.3 g/dL — ABNORMAL LOW (ref 30.0–36.0)
MCV: 76.6 fL — ABNORMAL LOW (ref 80.0–100.0)
Platelets: 154 10*3/uL (ref 150–400)
RBC: 3.55 MIL/uL — ABNORMAL LOW (ref 4.22–5.81)
RDW: 18.3 % — ABNORMAL HIGH (ref 11.5–15.5)
WBC: 11.3 10*3/uL — ABNORMAL HIGH (ref 4.0–10.5)
nRBC: 0.2 % (ref 0.0–0.2)

## 2018-06-27 LAB — BLOOD GAS, ARTERIAL
Acid-base deficit: 6 mmol/L — ABNORMAL HIGH (ref 0.0–2.0)
BICARBONATE: 18.6 mmol/L — AB (ref 20.0–28.0)
Drawn by: 441661
FIO2: 1
MECHVT: 530 mL
O2 SAT: 99.1 %
PCO2 ART: 35 mmHg (ref 32.0–48.0)
PEEP: 5 cmH2O
Patient temperature: 98.6
RATE: 24 resp/min
pH, Arterial: 7.346 — ABNORMAL LOW (ref 7.350–7.450)
pO2, Arterial: 162 mmHg — ABNORMAL HIGH (ref 83.0–108.0)

## 2018-06-27 LAB — APTT
aPTT: 36 seconds (ref 24–36)
aPTT: 35 seconds (ref 24–36)
aPTT: >200 seconds (ref 24–36)

## 2018-06-27 LAB — RESPIRATORY PANEL BY PCR
Adenovirus: NOT DETECTED
Bordetella pertussis: NOT DETECTED
CHLAMYDOPHILA PNEUMONIAE-RVPPCR: NOT DETECTED
Coronavirus 229E: NOT DETECTED
Coronavirus HKU1: NOT DETECTED
Coronavirus NL63: NOT DETECTED
Coronavirus OC43: NOT DETECTED
Influenza A: NOT DETECTED
Influenza B: NOT DETECTED
Metapneumovirus: NOT DETECTED
Mycoplasma pneumoniae: NOT DETECTED
PARAINFLUENZA VIRUS 3-RVPPCR: NOT DETECTED
Parainfluenza Virus 1: NOT DETECTED
Parainfluenza Virus 2: NOT DETECTED
Parainfluenza Virus 4: NOT DETECTED
Respiratory Syncytial Virus: NOT DETECTED
Rhinovirus / Enterovirus: NOT DETECTED

## 2018-06-27 LAB — HEPARIN LEVEL (UNFRACTIONATED)
Heparin Unfractionated: 0.41 IU/mL (ref 0.30–0.70)
Heparin Unfractionated: 0.54 IU/mL (ref 0.30–0.70)

## 2018-06-27 LAB — GLUCOSE, CAPILLARY
GLUCOSE-CAPILLARY: 122 mg/dL — AB (ref 70–99)
Glucose-Capillary: 231 mg/dL — ABNORMAL HIGH (ref 70–99)
Glucose-Capillary: 186 mg/dL — ABNORMAL HIGH (ref 70–99)
Glucose-Capillary: 213 mg/dL — ABNORMAL HIGH (ref 70–99)
Glucose-Capillary: 193 mg/dL — ABNORMAL HIGH (ref 70–99)
Glucose-Capillary: 130 mg/dL — ABNORMAL HIGH (ref 70–99)
Glucose-Capillary: 165 mg/dL — ABNORMAL HIGH (ref 70–99)
Glucose-Capillary: 145 mg/dL — ABNORMAL HIGH (ref 70–99)
Glucose-Capillary: 127 mg/dL — ABNORMAL HIGH (ref 70–99)
Glucose-Capillary: 116 mg/dL — ABNORMAL HIGH (ref 70–99)
Glucose-Capillary: 108 mg/dL — ABNORMAL HIGH (ref 70–99)
Glucose-Capillary: 113 mg/dL — ABNORMAL HIGH (ref 70–99)

## 2018-06-27 LAB — POCT I-STAT 4, (NA,K, GLUC, HGB,HCT)
GLUCOSE: 203 mg/dL — AB (ref 70–99)
Glucose, Bld: 234 mg/dL — ABNORMAL HIGH (ref 70–99)
Glucose, Bld: 235 mg/dL — ABNORMAL HIGH (ref 70–99)
HCT: 26 % — ABNORMAL LOW (ref 39.0–52.0)
HCT: 26 % — ABNORMAL LOW (ref 39.0–52.0)
HEMATOCRIT: 25 % — AB (ref 39.0–52.0)
Hemoglobin: 8.8 g/dL — ABNORMAL LOW (ref 13.0–17.0)
Hemoglobin: 8.8 g/dL — ABNORMAL LOW (ref 13.0–17.0)
Hemoglobin: 8.5 g/dL — ABNORMAL LOW (ref 13.0–17.0)
Potassium: 3 mmol/L — ABNORMAL LOW (ref 3.5–5.1)
Potassium: 2.8 mmol/L — ABNORMAL LOW (ref 3.5–5.1)
Potassium: 2.9 mmol/L — ABNORMAL LOW (ref 3.5–5.1)
Sodium: 139 mmol/L (ref 135–145)
Sodium: 138 mmol/L (ref 135–145)
Sodium: 138 mmol/L (ref 135–145)

## 2018-06-27 LAB — PROTIME-INR
INR: 1.36
INR: 1.5
INR: 1.62
Prothrombin Time: 16.7 seconds — ABNORMAL HIGH (ref 11.4–15.2)
Prothrombin Time: 17.9 seconds — ABNORMAL HIGH (ref 11.4–15.2)
Prothrombin Time: 19 seconds — ABNORMAL HIGH (ref 11.4–15.2)

## 2018-06-27 LAB — MAGNESIUM: Magnesium: 1.4 mg/dL — ABNORMAL LOW (ref 1.7–2.4)

## 2018-06-27 LAB — PREPARE RBC (CROSSMATCH)

## 2018-06-27 LAB — LACTIC ACID, PLASMA
Lactic Acid, Venous: 6.1 mmol/L (ref 0.5–1.9)
Lactic Acid, Venous: 2.5 mmol/L (ref 0.5–1.9)
Lactic Acid, Venous: 2.3 mmol/L (ref 0.5–1.9)

## 2018-06-27 LAB — HEMOGLOBIN AND HEMATOCRIT, BLOOD
HCT: 31.3 % — ABNORMAL LOW (ref 39.0–52.0)
Hemoglobin: 9.3 g/dL — ABNORMAL LOW (ref 13.0–17.0)

## 2018-06-27 LAB — ABO/RH: ABO/RH(D): B POS

## 2018-06-27 LAB — MRSA PCR SCREENING: MRSA by PCR: NEGATIVE

## 2018-06-27 LAB — PHOSPHORUS: Phosphorus: 3.3 mg/dL (ref 2.5–4.6)

## 2018-06-27 LAB — POTASSIUM: Potassium: 3.1 mmol/L — ABNORMAL LOW (ref 3.5–5.1)

## 2018-06-27 LAB — STREP PNEUMONIAE URINARY ANTIGEN: Strep Pneumo Urinary Antigen: NEGATIVE

## 2018-06-27 MED ORDER — FENTANYL 2500MCG IN NS 250ML (10MCG/ML) PREMIX INFUSION
100.0000 ug/h | INTRAVENOUS | Status: DC
  Administered 2018-06-27: 175 ug/h via INTRAVENOUS
  Administered 2018-06-27: 100 ug/h via INTRAVENOUS
  Administered 2018-06-28 (×2): 175 ug/h via INTRAVENOUS
  Filled 2018-06-27 (×4): qty 250

## 2018-06-27 MED ORDER — SODIUM CHLORIDE 0.9 % IV SOLN
500.0000 mg | INTRAVENOUS | Status: DC
  Administered 2018-06-28 – 2018-06-30 (×3): 500 mg via INTRAVENOUS
  Filled 2018-06-27 (×4): qty 500

## 2018-06-27 MED ORDER — AMIODARONE HCL IN DEXTROSE 360-4.14 MG/200ML-% IV SOLN
30.0000 mg/h | INTRAVENOUS | Status: DC
  Administered 2018-06-28 – 2018-07-01 (×7): 30 mg/h via INTRAVENOUS
  Filled 2018-06-27 (×9): qty 200

## 2018-06-27 MED ORDER — FENTANYL CITRATE (PF) 100 MCG/2ML IJ SOLN: 50.0000 ug | Freq: Once | INTRAMUSCULAR | Status: DC

## 2018-06-27 MED ORDER — ASPIRIN 81 MG PO CHEW
81.0000 mg | CHEWABLE_TABLET | Freq: Every day | ORAL | Status: DC
  Administered 2018-06-27 – 2018-06-29 (×3): 81 mg
  Filled 2018-06-27 (×3): qty 1

## 2018-06-27 MED ORDER — PROPOFOL 1000 MG/100ML IV EMUL
25.0000 ug/kg/min | INTRAVENOUS | Status: DC
  Administered 2018-06-26: 5 ug/kg/min via INTRAVENOUS
  Administered 2018-06-27 (×2): 50 ug/kg/min via INTRAVENOUS
  Administered 2018-06-27: 70 ug/kg/min via INTRAVENOUS
  Administered 2018-06-27: 25 ug/kg/min via INTRAVENOUS
  Administered 2018-06-27 – 2018-06-28 (×4): 50 ug/kg/min via INTRAVENOUS
  Administered 2018-06-28: 30 ug/kg/min via INTRAVENOUS
  Administered 2018-06-28: 50 ug/kg/min via INTRAVENOUS
  Administered 2018-06-28: 25 ug/kg/min via INTRAVENOUS
  Administered 2018-06-29: 30 ug/kg/min via INTRAVENOUS
  Administered 2018-06-29: 40 ug/kg/min via INTRAVENOUS
  Administered 2018-06-30: 25 ug/kg/min via INTRAVENOUS
  Filled 2018-06-27: qty 100
  Filled 2018-06-27: qty 200
  Filled 2018-06-27 (×4): qty 100
  Filled 2018-06-27: qty 200
  Filled 2018-06-27 (×3): qty 100
  Filled 2018-06-27: qty 200
  Filled 2018-06-27: qty 100

## 2018-06-27 MED ORDER — CHLORHEXIDINE GLUCONATE 0.12% ORAL RINSE (MEDLINE KIT)
15.0000 mL | Freq: Two times a day (BID) | OROMUCOSAL | Status: DC
  Administered 2018-06-27 – 2018-07-01 (×9): 15 mL via OROMUCOSAL

## 2018-06-27 MED ORDER — MAGNESIUM SULFATE 2 GM/50ML IV SOLN
2.0000 g | Freq: Once | INTRAVENOUS | Status: AC
  Administered 2018-06-27: 2 g via INTRAVENOUS
  Filled 2018-06-27: qty 50

## 2018-06-27 MED ORDER — CHLORHEXIDINE GLUCONATE 0.12 % MT SOLN
OROMUCOSAL | Status: AC
  Filled 2018-06-27: qty 15

## 2018-06-27 MED ORDER — ARTIFICIAL TEARS OPHTHALMIC OINT
1.0000 "application " | TOPICAL_OINTMENT | Freq: Three times a day (TID) | OPHTHALMIC | Status: DC
  Administered 2018-06-27 – 2018-07-01 (×13): 1 via OPHTHALMIC
  Filled 2018-06-27 (×2): qty 3.5

## 2018-06-27 MED ORDER — SODIUM CHLORIDE 0.9 % IV SOLN
1.0000 g | INTRAVENOUS | Status: DC
  Administered 2018-06-27 – 2018-06-30 (×4): 1 g via INTRAVENOUS
  Filled 2018-06-27 (×4): qty 10

## 2018-06-27 MED ORDER — CALCIUM CHLORIDE 10 % IV SOLN
INTRAVENOUS | Status: AC | PRN
  Administered 2018-06-26: 1 g via INTRAVENOUS

## 2018-06-27 MED ORDER — POTASSIUM CHLORIDE 10 MEQ/100ML IV SOLN
10.0000 meq | INTRAVENOUS | Status: AC
  Administered 2018-06-27 (×2): 10 meq via INTRAVENOUS
  Filled 2018-06-27 (×3): qty 100

## 2018-06-27 MED ORDER — EPINEPHRINE PF 1 MG/10ML IJ SOSY: PREFILLED_SYRINGE | INTRAMUSCULAR | Status: DC | PRN

## 2018-06-27 MED ORDER — PANTOPRAZOLE SODIUM 40 MG IV SOLR
40.0000 mg | Freq: Every day | INTRAVENOUS | Status: DC
  Administered 2018-06-27 – 2018-06-28 (×3): 40 mg via INTRAVENOUS
  Filled 2018-06-27 (×3): qty 40

## 2018-06-27 MED ORDER — POTASSIUM CHLORIDE 10 MEQ/100ML IV SOLN
10.0000 meq | INTRAVENOUS | Status: AC
  Administered 2018-06-27 (×2): 10 meq via INTRAVENOUS
  Filled 2018-06-27: qty 100

## 2018-06-27 MED ORDER — INSULIN ASPART 100 UNIT/ML ~~LOC~~ SOLN
3.0000 [IU] | SUBCUTANEOUS | Status: DC
  Administered 2018-06-27: 3 [IU] via SUBCUTANEOUS
  Administered 2018-06-27: 4 [IU] via SUBCUTANEOUS
  Administered 2018-06-27 – 2018-06-28 (×4): 3 [IU] via SUBCUTANEOUS
  Administered 2018-06-28 – 2018-06-29 (×2): 6 [IU] via SUBCUTANEOUS
  Administered 2018-06-29: 3 [IU] via SUBCUTANEOUS
  Administered 2018-06-29: 6 [IU] via SUBCUTANEOUS
  Administered 2018-06-29 (×2): 3 [IU] via SUBCUTANEOUS
  Administered 2018-06-29 – 2018-06-30 (×3): 6 [IU] via SUBCUTANEOUS

## 2018-06-27 MED ORDER — ORAL CARE MOUTH RINSE
15.0000 mL | OROMUCOSAL | Status: DC
  Administered 2018-06-27 – 2018-07-01 (×41): 15 mL via OROMUCOSAL

## 2018-06-27 MED ORDER — SODIUM CHLORIDE 0.9 % IV SOLN
INTRAVENOUS | Status: DC | PRN
  Administered 2018-06-27 – 2018-06-28 (×2): 500 mL via INTRAVENOUS

## 2018-06-27 MED ORDER — AMIODARONE HCL IN DEXTROSE 360-4.14 MG/200ML-% IV SOLN
INTRAVENOUS | Status: AC
  Administered 2018-06-27: 60 mg/h
  Filled 2018-06-27: qty 200

## 2018-06-27 MED ORDER — FENTANYL BOLUS VIA INFUSION
25.0000 ug | INTRAVENOUS | Status: DC | PRN
  Filled 2018-06-27: qty 25

## 2018-06-27 MED ORDER — CISATRACURIUM BOLUS VIA INFUSION
0.1000 mg/kg | Freq: Once | INTRAVENOUS | Status: DC
  Filled 2018-06-27: qty 10

## 2018-06-27 MED ORDER — SODIUM CHLORIDE 0.9 % IV SOLN
INTRAVENOUS | Status: DC
  Administered 2018-06-27: 05:00:00 via INTRAVENOUS

## 2018-06-27 MED ORDER — EPINEPHRINE PF 1 MG/10ML IJ SOSY
PREFILLED_SYRINGE | INTRAMUSCULAR | Status: AC | PRN
  Administered 2018-06-26: 1 mg via INTRAVENOUS

## 2018-06-27 MED ORDER — SODIUM CHLORIDE 0.9 % IV BOLUS: 1000.0000 mL | Freq: Once | INTRAVENOUS | Status: DC

## 2018-06-27 MED ORDER — POTASSIUM CHLORIDE 10 MEQ/100ML IV SOLN
10.0000 meq | INTRAVENOUS | Status: AC
  Administered 2018-06-27 (×3): 10 meq via INTRAVENOUS
  Filled 2018-06-27 (×4): qty 100

## 2018-06-27 MED ORDER — CALCIUM GLUCONATE-NACL 1-0.675 GM/50ML-% IV SOLN
1.0000 g | Freq: Once | INTRAVENOUS | Status: AC
  Administered 2018-06-27: 1000 mg via INTRAVENOUS
  Filled 2018-06-27: qty 50

## 2018-06-27 MED ORDER — EPINEPHRINE PF 1 MG/ML IJ SOLN
0.5000 ug/min | INTRAVENOUS | Status: DC
  Administered 2018-06-26: 50 ug/min via INTRAVENOUS

## 2018-06-27 MED ORDER — SODIUM CHLORIDE 0.9% IV SOLUTION: Freq: Once | INTRAVENOUS | Status: DC

## 2018-06-27 MED ORDER — HEPARIN (PORCINE) 25000 UT/250ML-% IV SOLN
1150.0000 [IU]/h | INTRAVENOUS | Status: DC
  Administered 2018-06-27 – 2018-06-28 (×2): 900 [IU]/h via INTRAVENOUS
  Administered 2018-06-29 – 2018-06-30 (×2): 1150 [IU]/h via INTRAVENOUS
  Filled 2018-06-27 (×6): qty 250

## 2018-06-27 MED ORDER — AMIODARONE HCL IN DEXTROSE 360-4.14 MG/200ML-% IV SOLN
INTRAVENOUS | Status: AC
  Filled 2018-06-27: qty 200

## 2018-06-27 MED ORDER — HEPARIN BOLUS VIA INFUSION
2000.0000 [IU] | Freq: Once | INTRAVENOUS | Status: AC
  Administered 2018-06-27: 2000 [IU] via INTRAVENOUS
  Filled 2018-06-27: qty 2000

## 2018-06-27 MED ORDER — SODIUM BICARBONATE 8.4 % IV SOLN
INTRAVENOUS | Status: AC | PRN
  Administered 2018-06-26: 50 meq via INTRAVENOUS

## 2018-06-27 MED ORDER — CISATRACURIUM BOLUS VIA INFUSION
0.0500 mg/kg | INTRAVENOUS | Status: AC | PRN
  Administered 2018-06-27: 5 mg via INTRAVENOUS
  Filled 2018-06-27: qty 5

## 2018-06-27 MED ORDER — SODIUM CHLORIDE 0.9 % IV SOLN
1.0000 ug/kg/min | INTRAVENOUS | Status: DC
  Administered 2018-06-27: 1 ug/kg/min via INTRAVENOUS
  Administered 2018-06-28: 1.5 ug/kg/min via INTRAVENOUS
  Filled 2018-06-27 (×4): qty 20

## 2018-06-27 MED ORDER — NOREPINEPHRINE-SODIUM CHLORIDE 4-0.9 MG/250ML-% IV SOLN
0.0000 ug/min | INTRAVENOUS | Status: DC
  Administered 2018-06-27: 8 ug/min via INTRAVENOUS
  Administered 2018-06-27: 6 ug/min via INTRAVENOUS
  Administered 2018-06-28: 20 ug/min via INTRAVENOUS
  Administered 2018-06-28: 3.493 ug/min via INTRAVENOUS
  Administered 2018-06-28: 25 ug/min via INTRAVENOUS
  Administered 2018-06-28: 20 ug/min via INTRAVENOUS
  Administered 2018-06-28: 30 ug/min via INTRAVENOUS
  Administered 2018-06-28: 10.5 ug/min via INTRAVENOUS
  Administered 2018-06-28: 15 ug/min via INTRAVENOUS
  Administered 2018-06-29: 9.333 ug/min via INTRAVENOUS
  Administered 2018-06-29 (×2): 33 ug/min via INTRAVENOUS
  Administered 2018-06-29: 28 ug/min via INTRAVENOUS
  Filled 2018-06-27 (×13): qty 250

## 2018-06-27 MED FILL — Medication: Qty: 1 | Status: AC

## 2018-06-27 NOTE — Procedures (Signed)
Arterial Catheter Insertion Procedure Note Christian Fry [id number] Feb 23, 1941  Procedure: Insertion of Arterial Catheter  Indications: Blood pressure monitoring and Frequent blood sampling  Procedure Details Consent: Unable to obtain consent because of altered level of consciousness. Time Out: Verified patient identification, verified procedure, site/side was marked, verified correct patient position, special equipment/implants available, medications/allergies/relevent history reviewed, required imaging and test results available.  Performed  Maximum sterile technique was used including antiseptics, cap, gloves, gown, hand hygiene, mask and sheet. Skin prep: Chlorhexidine; local anesthetic administered 22 gauge catheter was inserted into left radial artery using the Seldinger technique. ULTRASOUND GUIDANCE USED: NO Evaluation Blood flow good; BP tracing good. Complications: No apparent complications.   Christian Fry 06/27/2018

## 2018-06-27 NOTE — Progress Notes (Signed)
Pt transported from ED to 2H13 without incidence.

## 2018-06-27 NOTE — Progress Notes (Signed)
EEG completed; results pending.    

## 2018-06-27 NOTE — Code Documentation (Signed)
Pt began to brady down.

## 2018-06-27 NOTE — Progress Notes (Signed)
[facility] Progress Note Patient Name: Christian Fry DOB: 20-Jun-1940 MRN: [id number]   Date of Service  06/27/2018  HPI/Events of Note  Multiple issues: 1. Hypokalemia - K+ = 3.0 and Creatinine = 1.30 and 2. Troponin = 0.07 in setting of post cardiac arrest.   eICU Interventions  Will order: 1. Replace K+.  2. ASA 81 mg per tube now and Q day.      Intervention Category Major Interventions: Electrolyte abnormality - evaluation and management  [name],[name] 06/27/2018, 3:20 AM

## 2018-06-27 NOTE — Progress Notes (Signed)
[facility] Physician-Brief Progress Note Patient Name: Christian Fry DOB: 11/12/1940 MRN: [id number]   Date of Service  06/27/2018  HPI/Events of Note  Lactic Acid = 6.1 --> 2.5 in setting of post cardiac arrest. Lactic acid appears to be clearing.   eICU Interventions  Continue to trend Lactic Acid.     Intervention Category Major Interventions: Acid-Base disturbance - evaluation and management  [name],[name] 06/27/2018, 3:51 AM

## 2018-06-27 NOTE — Progress Notes (Signed)
Respiratory Panel negative, RN discontinued droplet precautions.

## 2018-06-27 NOTE — Code Documentation (Signed)
Pt cardioverted at 150J

## 2018-06-27 NOTE — Code Documentation (Signed)
Pt did not convert with initial shock, cardioverted at 200J. Pt converted.

## 2018-06-27 NOTE — Code Documentation (Signed)
Pt converted into V-Tach with a pulse.

## 2018-06-27 NOTE — Progress Notes (Signed)
ANTICOAGULATION CONSULT NOTE - Initial Consult  Pharmacy Consult for heparin Indication: pulmonary embolus in setting of cardiac arrest >> code cool  No Known Allergies  Patient Measurements: Height: 5\' 7"  (170.2 cm) Weight: 220 lb 7.4 oz (100 kg) IBW/kg (Calculated) : 66.1 Heparin Dosing Weight: 90kg  Vital Signs: Temp: 93.6 F (34.2 C) (01/28 0040) BP: 130/79 (01/28 0040) Pulse Rate: 109 (01/28 0040)  Labs: Recent Labs    06/06/2018 2214 06/25/2018 2235  HGB 8.3* 9.5*  HCT 31.8* 28.0*  PLT 152  --   CREATININE 1.36*  --     Estimated Creatinine Clearance: 52.9 mL/min (A) (by C-G formula based on SCr of 1.36 mg/dL (H)).   Medical History: Past Medical History:  Diagnosis Date  . Diabetes mellitus without complication ([place])   . Glaucoma   . Hypertension   . Prostate cancer [facility])     Assessment: 78yo male presents after cardiac arrest, CT reveals PE w/ small clot burden though there is evidence of RHS >> to start heparin; of note pt will be started on hypothermia protocol and heparin requirements will initially be lower.  Goal of Therapy:  Heparin level 0.3-0.7 units/ml Monitor platelets by anticoagulation protocol: Yes   Plan:  Will give heparin 2000 units IV bolus x1 followed by gtt at 900 units/hr and monitor heparin levels and CBC.  Wynona Neat, PharmD, BCPS  06/27/2018,12:45 AM

## 2018-06-27 NOTE — Progress Notes (Signed)
[facility] for heparin Indication: pulmonary embolus in setting of cardiac arrest >> code cool  No Known Allergies  Patient Measurements: Height: 5\' 7"  (170.2 cm) Weight: 220 lb 7.4 oz (100 kg) IBW/kg (Calculated) : 66.1 Heparin Dosing Weight: 90kg  Vital Signs: Temp: 91.2 F (32.9 C) (01/28 0900) Temp Source: Bladder (01/28 0900) BP: 103/69 (01/28 0900) Pulse Rate: 89 (01/28 0900)  Labs: Recent Labs    06/02/2018 2214  06/27/18 0024  06/27/18 0251 06/27/18 0448 06/27/18 0649 06/27/18 0659 06/27/18 0800 06/27/18 0820 06/27/18 0824  HGB 8.3*   < >  --    < >  --  8.8* 8.5*  --   --  7.7*  --   HCT 31.8*   < >  --    < >  --  26.0* 25.0*  --   --  27.2*  --   PLT 152  --   --   --   --   --   --   --   --  154  --   APTT  --   --  36  --  35  --   --   --   --   --  >200*  LABPROT  --   --  16.7*  --  17.9*  --   --   --   --   --  19.0*  INR  --   --  1.36  --  1.50  --   --   --   --   --  1.62  HEPARINUNFRC  --   --   --   --   --   --   --   --  0.41  --   --   CREATININE 1.36*  --  1.30*  --   --   --   --  1.23  --   --   --   TROPONINI  --   --  0.07*  --   --   --   --  0.23*  --   --   --    < > = values in this interval not displayed.    Estimated Creatinine Clearance: 58.5 mL/min (by C-G formula based on SCr of 1.23 mg/dL).   Medical History: Past Medical History:  Diagnosis Date  . Diabetes mellitus without complication ([place])   . Glaucoma   . Hypertension   . Prostate cancer [facility])     Assessment: 78yo male presents after cardiac arrest, CT reveals PE w/ small clot burden though there is evidence of RHS >> on hypothermia protocol.  Initial heparin level 0.41.  No overt bleeding or complications noted.   Goal of Therapy:  Heparin level 0.3-0.7 units/ml Monitor platelets by anticoagulation protocol: Yes   Plan:  Continue IV heparin at current rate. Confirm heparin level in 6 hrs. Daily heparin level and  CBC.  Marguerite Olea, [facility] Clinical Pharmacist Phone [phone]  06/27/2018 10:03 AM

## 2018-06-27 NOTE — Progress Notes (Signed)
Bilateral lower extremities venous duplex exam completed. Please see preliminary notes on CV PROC under chart review. Tyvion Edmondson H Rielle Schlauch(RDMS RVT) 06/27/18 4:39 PM

## 2018-06-27 NOTE — Consult Note (Addendum)
Cardiology Consultation:   Patient ID: Christian Fry MRN: [id number]; DOB: 1940-10-31  Admit date: 06/10/2018 Date of Consult: 06/27/2018  Primary Care Provider: Patient, No Pcp Per Primary Cardiologist: new, Ellyn Hack Primary Electrophysiologist:  None    Patient Profile:   Christian Fry is a 78 y.o. male with a hx of HTN, prostate cancer, and DM who is being seen today for the evaluation of cardiac arrest at the request of Dr. Nelda Marseille.  History of Present Illness:   Christian Fry was brought to [facility] after a witnessed cardiac arrest. He was being driven by a friend who turned into a fire station when he became unresponsive. He had a PEA/asystolic arrest with ROSC after CPR and epi x 1. Pulses lost en route to ED, ROSC again achieved. He had a total of 3 PEA arrests with ROSC achieved. He had an additional short VT arrest requiring cardioversion and amiodarone. He is currently intubated with the cooling protocol initiated. He was found to have bilateral pneumonia and  PE (LUL) with evidence of right heart strain. Pressors weaned, now hypertensive. Heparin drip initiated, amiodarone drip continued. Plan for warming on 06/28/18. ABX initiated for PNA - near complete consolidation in RLL. Respiratory panel negative.  Cardiology consulted by critical care. Nephew and pastor at bedside states pt has been sick with a cough and a cold for 3-4 weeks. He has not prior cardiac history, no hx of MI or cardiomyopathy. No family history of early MI or sudden cardiac death.    Past Medical History:  Diagnosis Date  . Diabetes mellitus without complication ([place])   . Glaucoma   . Hypertension   . Prostate cancer [facility])     Past Surgical History:  Procedure Laterality Date  . PROSTATE BIOPSY    . TONSILECTOMY/ADENOIDECTOMY WITH MYRINGOTOMY       Home Medications:  Prior to Admission medications   Medication Sig Start Date End Date Taking? Authorizing Provider  Canagliflozin (INVOKANA PO) Take by mouth.     [provider]  Cholecalciferol (VITAMIN D PO) Take by mouth.    [provider]  hydrochlorothiazide (HYDRODIURIL) 25 MG tablet Take 25 mg by mouth daily.    [provider]  Iron-FA-B Cmp-C-Biot-Probiotic (FUSION PLUS) CAPS Take 1 capsule by mouth daily. 05/11/18   [provider]  metFORMIN (GLUCOPHAGE) 1000 MG tablet Take 1,000 mg by mouth 2 (two) times daily with a meal.    [provider]  ofloxacin (OCUFLOX) 0.3 % ophthalmic solution 1 drop 4 (four) times daily.    [provider]  Vitamin D, Ergocalciferol, (DRISDOL) 1.25 MG (50000 UT) CAPS capsule Take 50,000 Units by mouth once a week. 04/19/18   [provider]    Inpatient Medications: Scheduled Meds: . sodium chloride   Intravenous Once  . artificial tears  1 application Both Eyes X1G  . aspirin  81 mg Per Tube Daily  . chlorhexidine gluconate (MEDLINE KIT)  15 mL Mouth Rinse BID  . cisatracurium  0.1 mg/kg Intravenous Once  . fentaNYL (SUBLIMAZE) injection  50 mcg Intravenous Once  . insulin aspart  3-9 Units Subcutaneous Q4H  . mouth rinse  15 mL Mouth Rinse 10 times per day  . pantoprazole (PROTONIX) IV  40 mg Intravenous QHS   Continuous Infusions: . sodium chloride Stopped (06/27/18 0609)  . sodium chloride Stopped (06/27/18 0650)  . azithromycin    . cefTRIAXone (ROCEPHIN)  IV Stopped (06/27/18 6269)  . cisatracurium (NIMBEX) infusion 1.5 mcg/kg/min (06/27/18 0700)  .  fentaNYL infusion INTRAVENOUS 175 mcg/hr (06/27/18 0700)  . heparin 900 Units/hr (06/27/18 0700)  . norepinephrine (LEVOPHED) Adult infusion 5 mcg/min (06/27/18 1018)  . propofol (DIPRIVAN) infusion 50 mcg/kg/min (06/27/18 1403)   PRN Meds: sodium chloride, fentaNYL  Allergies:    Allergies  Allergen Reactions  . Amlodipine Other (See Comments)    Headaches  . Diltiazem Other (See Comments)    Headaches    Social History:   Social History   Socioeconomic History  . Marital  status: Widowed    Spouse name: Not on file  . Number of children: 0  . Years of education: Not on file  . Highest education level: Not on file  Occupational History  . Not on file  Social Needs  . Financial resource strain: Not hard at all  . Food insecurity:    Worry: Not on file    Inability: Not on file  . Transportation needs:    Medical: No    Non-medical: No  Tobacco Use  . Smoking status: Never Smoker  . Smokeless tobacco: Never Used  Substance and Sexual Activity  . Alcohol use: No  . Drug use: No  . Sexual activity: Yes  Lifestyle  . Physical activity:    Days per week: Not on file    Minutes per session: Not on file  . Stress: Not on file  Relationships  . Social connections:    Talks on phone: Not on file    Gets together: Not on file    Attends religious service: Not on file    Active member of club or organization: Not on file    Attends meetings of clubs or organizations: Not on file    Relationship status: Not on file  . Intimate partner violence:    Fear of current or ex partner: Not on file    Emotionally abused: Not on file    Physically abused: Not on file    Forced sexual activity: Not on file  Other Topics Concern  . Not on file  Social History Narrative  . Not on file    Family History:    Family History  Problem Relation Age of Onset  . Diabetes Mother   . Heart attack Father      ROS:  Please see the history of present illness.   All other ROS reviewed and negative.     Physical Exam/Data:   Vitals:   06/27/18 1315 06/27/18 1330 06/27/18 1331 06/27/18 1400  BP:   101/66 (!) 86/61  Pulse: 78 78 67 70  Resp: (!) 24 (!) 24 (!) 24 (!) 24  Temp: (!) 91.4 F (33 C)  (!) 91.4 F (33 C) (!) 91.4 F (33 C)  TempSrc: Bladder  Bladder Bladder  SpO2: 100% 99% 100% 100%  Weight:      Height:        Intake/Output Summary (Last 24 hours) at 06/27/2018 1430 Last data filed at 06/27/2018 1400 Gross per 24 hour  Intake 2790.74 ml    Output 525 ml  Net 2265.74 ml   Last 3 Weights 06/16/2018 05/04/2016 03/18/2016  Weight (lbs) 220 lb 7.4 oz 216 lb 202 lb 12.8 oz  Weight (kg) 100 kg 97.977 kg 91.989 kg     Body mass index is 34.53 kg/m.  General:  Sedated and paralyzed on vent Cardiac:  Regular rhythm, regular rate Lungs: on Ventilator   Abd: soft, nontender, no hepatomegaly  Ext: 3+ pitting edema B Musculoskeletal:  No deformities,  BUE and BLE strength normal and equal Skin: warm and dry  Neuro: sedated and paralyzed Psych:  Sedated and paralyzed  EKG:  The EKG was personally reviewed and demonstrates:  p waves seen on EKG today, rhythm unclear yesterday, appears irregular Telemetry:  Telemetry was personally reviewed and demonstrates: sinus with wandering pacemaker vs Aflutter vs junctional rhythm I also personally reviewed these findings, very difficult to determine the true rhythm.  There does appear to be P waves, but with multiple different morphologies.  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 fib as it is irregularly irregular.  Relevant CV Studies:  Echo pending  Laboratory Data:  Chemistry Recent Labs  Lab 06/27/18 0024  06/27/18 5027 06/27/18 0659 06/27/18 0946 06/27/18 1203  NA 135   < > 138 133* 135  --   K 3.0*   < > 2.9* 3.0* 3.0* 3.1*  CL 102  --   --  101 105  --   CO2 15*  --   --  17* 17*  --   GLUCOSE 250*   < > 203* 204* 187*  --   BUN 23  --   --  23 24*  --   CREATININE 1.30*  --   --  1.23 1.28*  --   CALCIUM 8.3*  --   --  7.8* 8.4*  --   GFRNONAA 53*  --   --  57* 54*  --   GFRAA >60  --   --  >60 >60  --   ANIONGAP 18*  --   --  15 13  --    < > = values in this interval not displayed.    Recent Labs  Lab 06/01/2018 2214  PROT 6.7  ALBUMIN 3.5  AST 64*  ALT 38  ALKPHOS 100  BILITOT 1.3*   Hematology Recent Labs  Lab 06/15/2018 2214  06/27/18 0448 06/27/18 0649 06/27/18 0820  WBC 12.2*  --   --   --  11.3*  RBC  3.78*  --   --   --  3.55*  HGB 8.3*   < > 8.8* 8.5* 7.7*  HCT 31.8*   < > 26.0* 25.0* 27.2*  MCV 84.1  --   --   --  76.6*  MCH 22.0*  --   --   --  21.7*  MCHC 26.1*  --   --   --  28.3*  RDW 18.6*  --   --   --  18.3*  PLT 152  --   --   --  154   < > = values in this interval not displayed.   Cardiac Enzymes Recent Labs  Lab 06/27/18 0024 06/27/18 0659 06/27/18 1203  TROPONINI 0.07* 0.23* 0.23*    Recent Labs  Lab 06/24/2018 2219  TROPIPOC 0.07    BNP Recent Labs  Lab 06/15/2018 2214  BNP 517.7*    DDimer No results for input(s): DDIMER in the last 168 hours.  Radiology/Studies:  Ct Head Wo Contrast  Result Date: 06/16/2018 CLINICAL DATA:  Unresponsive EXAM: CT HEAD WITHOUT CONTRAST TECHNIQUE: Contiguous axial images were obtained from the base of the skull through the vertex without intravenous contrast. COMPARISON:  None. FINDINGS: Brain: No acute intracranial abnormality. Specifically, no hemorrhage, hydrocephalus, mass lesion, acute infarction, or significant intracranial injury. Vascular: No hyperdense vessel or unexpected calcification. Skull: No acute calvarial abnormality. Sinuses/Orbits: No acute findings Other: None IMPRESSION: No acute intracranial abnormality. Electronically  Signed   By: Rolm Baptise M.D.   On: 06/04/2018 23:56   Ct Angio Chest Pe W And/or Wo Contrast  Result Date: 06/27/2018 CLINICAL DATA:  Cardiac arrest. EXAM: CT ANGIOGRAPHY CHEST WITH CONTRAST TECHNIQUE: Multidetector CT imaging of the chest was performed using the standard protocol during bolus administration of intravenous contrast. Multiplanar CT image reconstructions and MIPs were obtained to evaluate the vascular anatomy. CONTRAST:  153m ISOVUE-370 IOPAMIDOL (ISOVUE-370) INJECTION 76% COMPARISON:  Chest radiograph earlier this day. FINDINGS: Cardiovascular: Positive for acute PE with filling defect in the left upper lobe pulmonary artery. Clot extends into the segmental branches. No other  pulmonary artery filling defects. There is right heart strain with RV to LV ratio of 2.9. Coronary artery calcifications versus stents. Aortic atherosclerosis without evidence of dissection. No pericardial effusion. Mediastinum/Nodes: Endotracheal tube tip above the carina. Enteric tube tip below the diaphragm. No mediastinal or evidence of hilar adenopathy, left hilar pulmonary opacity limits detailed assessment. No visualized thyroid nodule. Lungs/Pleura: Near complete consolidation throughout the right lower lobe, small portion of aerated lung anteriorly demonstrates ground-glass opacities. Dense consolidation in the perihilar left lower lobe with surrounding ground-glass opacities. Lesser ground-glass opacities in the perihilar left upper lobe. Moderate right and small left pleural effusion, partially loculated on the right. No pneumothorax. No septal thickening. Minimal mucus distal to the endotracheal tube, the trachea and mainstem bronchi are otherwise clear. Upper Abdomen: No acute upper abdominal findings. 9 mm hypodensity in the left lobe of the liver is incompletely characterized. Musculoskeletal: Nondisplaced sternal fracture. No definite acute rib fractures with special attention to the anterior ribs. No focal osseous lesion. Review of the MIP images confirms the above findings. IMPRESSION: 1. Positive for acute PE with left upper lobe thrombus. While clot burden is small to moderate, there is CT evidence of right heart strain (RV/LV Ratio = 2.9) consistent with at least submassive (intermediate risk) PE. The presence of right heart strain has been associated with an increased risk of morbidity and mortality. Please activate Code PE by paging [phone] 2. Near complete consolidation throughout the right lower lobe, small portion of aerated lung anteriorly. Dense consolidation in the perihilar left lower lobe with surrounding ground-glass opacities. Findings likely due to multifocal pneumonia, lack  of significant debris in the airway favors against aspiration. Recommend continued radiographic follow-up to resolution to exclude underlying pulmonary mass. 3. Moderate right and small left pleural effusion, partially loculated on the right. 4. Nondisplaced sternal fracture. 5. Non-specific subcentimeter low-density lesion in the liver. Aortic Atherosclerosis (ICD10-I70.0). Critical Value/emergent results were called by telephone at the time of interpretation on 06/27/2018 at 12:12 am to Dr. RWyvonnia Dusky, who verbally acknowledged these results. Electronically Signed   By: MKeith RakeM.D.   On: 06/27/2018 00:13   Dg Chest Portable 1 View  Result Date: 06/15/2018 CLINICAL DATA:  Tube placement EXAM: PORTABLE CHEST 1 VIEW COMPARISON:  None. FINDINGS: Endotracheal tube is 4 cm above the carina. NG tube enters the stomach. Cardiomegaly. Diffuse airspace disease throughout the right lung. Small to moderate right pleural effusion. Mild perihilar opacity on the left. Findings could reflect asymmetric edema or infection. IMPRESSION: Endotracheal tube 4 cm above the carina. Asymmetric airspace disease, right worse than left with right effusion. Findings could reflect asymmetric edema or infection. Electronically Signed   By: KRolm BaptiseM.D.   On: 06/28/2018 22:43   Dg Abd Portable 1 View  Result Date: 06/17/2018 CLINICAL DATA:  Gastric tube placement EXAM: PORTABLE ABDOMEN -  1 VIEW COMPARISON:  None. FINDINGS: The tip and side port of a gastric tube are noted in the left upper quadrant of the abdomen in the expected location the stomach. Moderate stool retention is seen within the colon. There is thoracolumbar spondylosis. Surgical clips project over the pubic symphysis. IMPRESSION: Gastric tube is noted in satisfactory position within the left upper quadrant of the abdomen in the expected location of the stomach. Electronically Signed   By: Ashley Royalty M.D.   On: 06/28/2018 22:45    Assessment and Plan:    Witnessed cardiac arrest PE Bilateral PNA RV strain Pt with witnessed cardiac arrest with ROSC following CPR, epi x 1; additional VT arrest with cardioversion and amiodarone administration. CTA with PE and evidence of RV strain. Heparin drips started. Patient remains sedated and paralyzed with cooling protocol. EKG with accelerated junctional rhythm, no prior to compare. Troponin found elevated: 0.07 --> 0.23 --> 0.23, likely secondary to arrest and CPR, not CAD. Bilateral lower extremity pitting edema in setting of right heart strain. Pt does not smoke and has no history of CAD. Presentation and clinical picture consistent with respiratory arrest. If patient regains neurologic status, will re-evaluate for utility of cardiac catheterization. Echo pending. Recommend daily EKG to follow rhythm changes.       Signed, Ledora Bottcher, PA  06/27/2018 2:30 PM   ATTENDING ATTESTATION  I have seen, examined and evaluated the patient this PM along with Ledora Bottcher, PA .  After reviewing all the available data and chart, we discussed the patients laboratory, study & physical findings as well as symptoms in detail. I agree with her findings, examination as well as impression recommendations as per our discussion.    Attending adjustments noted in italics.   Very unfortunate gentleman with PEA arrest and ROSC after CPR -defibrillation resultant V. tach.  He has minimal troponin elevation, but has been found to have pulmonary embolus and bilateral pneumonia.  Per the family and it sounds like he has had unilateral swelling for a couple weeks but then yesterday he had bilateral lower extremity edema.  He has not had any recent travel, but does sit at his desk for long periods of time.  Wonder if the unilateral edema was related to developing DVT this subsequently propagated to more proximal veins leading to bilateral edema prior to his PE.  I do not think that this elevated troponin is related  to a non-ST elevation MI/ACS.  It is more consistent with demand ischemia or myocardial injury related to pulmonary embolus and cardiopulmonary arrest with hypoxia and hypotension in the setting of his resuscitation.  Echocardiogram is pending, more information to follow after this report.  For now, he is already on IV heparin for PE which would cover [place] from a CVA protection standpoint for possible A. Fib.  Judicious diuresis for his edema based on the fact that there is some volume dependency with PE (and possible strain pattern).  We will monitor at a distance and provide assistance as necessary after extubation and echocardiogram was read.   Glenetta Hew, M.D., M.S. Interventional Cardiologist   Pager # [phone] Phone # [phone] [address] [place], [place] [address]     For questions or updates, please contact [place] Please consult [email or web address] for contact info under

## 2018-06-27 NOTE — ED Notes (Signed)
Delay in transport due to the insertion of an A-line.

## 2018-06-27 NOTE — Procedures (Signed)
ELECTROENCEPHALOGRAM REPORT   Patient: Christian Fry       Room #: [facility] EEG No. ID: [id number] Age: 78 y.o.        Sex: male Referring Physician: Nelda Marseille Report Date:  06/27/2018        Interpreting Physician: Alexis Goodell  History: [name] is an 78 y.o. male s/p arrest  Medications:  Amiodarone, Nimbex, Fentanyl, Heparin, Levophed, Diprovan, Protonix, Rocephin, Zithromax, Insulin, ASA  Conditions of Recording:  This is a 21 channel routine scalp EEG performed with bipolar and monopolar montages arranged in accordance to the international 10/20 system of electrode placement. One channel was dedicated to EKG recording.  The patient is in the intubated and sedated state.  Description:  The background activity is discontinuous.  There are prolonged periods of attenuation lasting from 40- 120 seconds, being most commonly greater than 70 seconds.  These periods of attenuation alternate with synchronous burst activity that consists of a mixture of irregular frequencies, delta and theta being the most predominant.  Some intermixed sharper activity is noted as well at times. This pattern is persistent throughout the recording.  Hyperventilation and intermittent photic stimulation were not performed.   IMPRESSION: This is an abnormal EEG due to a burst-suppression pattern seen throughout the tracing.  Burst suppression pattern can be seen in a variety of circumstances, but in this circumstance is likely secondary to medications.  Clinical/neurological, and radiographic correlation advised.     Alexis Goodell, MD Neurology [phone] 06/27/2018, 3:11 PM

## 2018-06-27 NOTE — Progress Notes (Addendum)
NAME:  Christian Fry, MRN:  [id number], DOB:  11-Jul-1940, LOS: 0 ADMISSION DATE:  06/27/2018, CONSULTATION DATE:  1/27 REFERRING MD:  Dr. Dayna Barker, CHIEF COMPLAINT:  Cardiac arrest   Brief History   78 year old woman who was admit 1/27 after witnessed cardiac arrest with 23 minutes of resuscitation prior to [name]. He was driving with a friend when he began to feel unwell then lost consciousness, his friend brought him to a fire station where they did not appreciate a pulse and started CPR. An AED was placed and did not recommend shock. ROSC was achieved after one dose of epinephrine. He lost pulses again en route to the ED with ROSC after 5 minutes. He was found to have RLL PNA and PE with RV strain. His labwork revealed pH 7.11, pCO2 55, pO2 75, bicarb 17 on blood gas, trop 0.07, Glucose 279, Ca 8.1, BNP 517, WBC 12 and hemoglobin 8.  Family described months of progressive SOB and lower extremity edema.   Past Medical History  Diabetes Mellitus  Hypertension   Significant Hospital Events   1/27 admit after witnessed cardiac arrest with ROSC after 23 minutes.   Consults:    Procedures:  ETT 1/28 > Arterial line 1/28>   Significant Diagnostic Tests:  CTA chest 1/27 > Positive for acute PE with left upper lobe thrombus. While clot burden is small to moderate, there is CT evidence of right heart strain (RV/LV Ratio = 2.9). Near complete consolidation throughout the right lower lobe. Dense consolidation in the perihilar left lower lobe with surrounding ground-glass opacities. CT head 1/27 > No acute intracranial abnormality. Echo 1/28 >  Micro Data:  Blood Culture 1/27 > Sputum 1/27 > RVP 1/28 >  Strep pneumoniae 1/28 >  MRSA neg   Antimicrobials:  Zosyn Vancomycin 1/27 in ED Ceftriaxone 1/28 > Azithromycin 1/28 >  Interim history/subjective:    Objective   Blood pressure 135/81, pulse 93, temperature (!) 93.7 F (34.3 C), resp. rate (!) 24, height 5\' 7"  (1.702 m), weight 100 kg,  SpO2 99 %.    Vent Mode: PRVC FiO2 (%):  [90 %-100 %] 90 % Set Rate:  [18 bmp-26 bmp] 24 bmp Vt Set:  [520 mL-530 mL] 530 mL PEEP:  [8 GUR42-70 cmH20] 8 cmH20 Plateau Pressure:  [20 cmH20-23 cmH20] 21 cmH20   Intake/Output Summary (Last 24 hours) at 06/27/2018 6237 Last data filed at 06/27/2018 0500 Gross per 24 hour  Intake 500 ml  Output 225 ml  Net 275 ml   Filed Weights   06/10/2018 2309  Weight: 100 kg    Examination: General: ill appearing  HENT: ETT, OGT  Lungs: course breath sounds at both bases  Cardiovascular: regular rate and rhythm, JVD, 1+ bl lower extremity edema  Abdomen: cooling pads in place  Extremities: warm, 1+ lower extremity edema  Neuro: no reaction to voice or touch, pupils are small and non reactive  GU: foley with clear yellow urine bag   Resolved Hospital Problem list     Assessment & Plan:  Post Cardiac arrest  Shock, cardiogenic vs septic  NSTEMI  Lactic acidosis  Acute anoxic encephalopathy  He was a PEA/asystolic arrest for his first 3 periods of arrest, then had once short arrest of VT requiring cardioversion and amiodarone.  - Targeted Temp Mgmt 33C  - levophed to keep map > 75  - amiodarone  - asa 81, hold off on bblocker and ace/arb given shock and AKI  - continue to  trend troponin  - follow up repeat EKG this AM   Acute hypoxic respiratory failure secondary to pulmonary embolism and post cardiac arrest  Acute Hypercarbic respiratory acidosis  Pulmonary embolism  Community acquired PNA  - full vent support  fentanyl gtt, propofol gtt, prn fentanyl  - heparin gtt  - ctx and azithromycin  - follow up lower extremity doppler  - follow up echo  - follow up RVP, urine strep and legionella   AKI - non oliguric  Metabolic acidosis  S/p 1 L NS bolus, now on NS @ 50 cc/hr  - monitor renal function and urine output   Hypokalemia  - replacing 10 meq x6  - follow up this afternoon   Hypocalcemia  - ca gluconate 1 g   - follow  up ionized ca   Hypomagnesemia  -provide 2g mag  - follow up tomorrow   Diabetes Mellitus  - SSI and CBG   Normocytic anemia Iron deficiency ( ferritin 12 in care everywhere from 04/2018)   - hemoglobin 7.7 this morning, transfuse one unit  - transfuse for hemoglobin < 8   HTN  - hold home HCTZ   Best practice:  Diet: start tube feeds  Pain/Anxiety/Delirium protocol (if indicated): fentanyl gtt, propofol gtt, prn fentanyl  VAP protocol (if indicated): yes DVT prophylaxis: heparin gtt  GI prophylaxis: IV protonix 40 mg qd  Glucose control: SSI Mobility: bed rest  Code Status: full Family Communication: non at bedside this morning, will attempt to contact today  Disposition: guarded, will be able to better prognosticate after cooling protocol   Labs   CBC: Recent Labs  Lab 06/12/2018 2214 06/03/2018 2235 06/27/18 0052  WBC 12.2*  --   --   NEUTROABS 8.7*  --   --   HGB 8.3* 9.5* 9.5*  HCT 31.8* 28.0* 28.0*  MCV 84.1  --   --   PLT 152  --   --     Basic Metabolic Panel: Recent Labs  Lab 06/07/2018 2214 06/20/2018 2235 06/27/18 0024 06/27/18 0052  NA 135 137 135 136  K 3.5 2.7* 3.0* 3.0*  CL 104  --  102  --   CO2 14*  --  15*  --   GLUCOSE 279*  --  250*  --   BUN 22  --  23  --   CREATININE 1.36*  --  1.30*  --   CALCIUM 8.1*  --  8.3*  --   MG 1.8  --   --   --    GFR: Estimated Creatinine Clearance: 55.3 mL/min (A) (by C-G formula based on SCr of 1.3 mg/dL (H)). Recent Labs  Lab 06/02/2018 2214 06/27/18 0006 06/27/18 0228  WBC 12.2*  --   --   LATICACIDVEN  --  6.1* 2.5*    Liver Function Tests: Recent Labs  Lab 06/10/2018 2214  AST 64*  ALT 38  ALKPHOS 100  BILITOT 1.3*  PROT 6.7  ALBUMIN 3.5   Recent Labs  Lab 05/31/2018 2214  LIPASE 30   No results for input(s): AMMONIA in the last 168 hours.  ABG    Component Value Date/Time   PHART 7.346 (L) 06/27/2018 0330   PCO2ART 35.0 06/27/2018 0330   PO2ART 162 (H) 06/27/2018 0330   HCO3  18.6 (L) 06/27/2018 0330   TCO2 19 (L) 06/27/2018 0052   ACIDBASEDEF 6.0 (H) 06/27/2018 0330   O2SAT 99.1 06/27/2018 0330     Coagulation Profile: Recent Labs  Lab  06/27/18 0024 06/27/18 0251  INR 1.36 1.50    Cardiac Enzymes: Recent Labs  Lab 06/27/18 0024  TROPONINI 0.07*    HbA1C: No results found for: HGBA1C  CBG: Recent Labs  Lab 06/11/2018 2306 06/27/18 0302 06/27/18 0446 06/27/18 0556  GLUCAP 263* 231* 186* 213*    Review of Systems:     Past Medical History  He,  has a past medical history of Diabetes mellitus without complication ([place]), Glaucoma, Hypertension, and Prostate cancer ([place]).   Surgical History    Past Surgical History:  Procedure Laterality Date  . PROSTATE BIOPSY    . TONSILECTOMY/ADENOIDECTOMY WITH MYRINGOTOMY       Social History   reports that he has never smoked. He has never used smokeless tobacco. He reports that he does not drink alcohol or use drugs.   Family History   His family history includes Diabetes in his mother; Heart attack in his father.   Allergies No Known Allergies   Home Medications  Prior to Admission medications   Medication Sig Start Date End Date Taking? Authorizing Provider  Canagliflozin (INVOKANA PO) Take by mouth.    [provider]  Cholecalciferol (VITAMIN D PO) Take by mouth.    [provider]  hydrochlorothiazide (HYDRODIURIL) 25 MG tablet Take 25 mg by mouth daily.    [provider]  metFORMIN (GLUCOPHAGE) 1000 MG tablet Take 1,000 mg by mouth 2 (two) times daily with a meal.    [provider]  ofloxacin (OCUFLOX) 0.3 % ophthalmic solution 1 drop 4 (four) times daily.    [provider]    Attending Note:  78 year old male with bilateral pneumonia and pleural effusion with a PE who suffered a cardiac arrest and NSTEMI.  Overnight, no events.  Patient is sedated and paralyzed on exam with clear lungs.  I reviewed CXR myself, ETT is in place and  pleural effusion noted.  Spoke with family at length, case was explained and questions of the patient's wishes posed.  Will continue full code for now.  Heparin for PE.  Rocephin/zithromax for CAP.  Pressors for BP support.  Warming to be started at 3:30 AM on 1/29 then will meet with family for further conversation.  Will need a thora if neuro is intact.  Will call cards on consultation.  The patient is critically ill with multiple organ systems failure and requires high complexity decision making for assessment and support, frequent evaluation and titration of therapies, application of advanced monitoring technologies and extensive interpretation of multiple databases.   Critical Care Time devoted to patient care services described in this note is  32  Minutes. This time reflects time of care of this signee Dr Jennet Maduro. This critical care time does not reflect procedure time, or teaching time or supervisory time of PA/NP/Med student/Med Resident etc but could involve care discussion time.  Rush Farmer, M.D. [facility] Pulmonary/Critical Care Medicine. Pager: [phone]. After hours pager: [phone].

## 2018-06-27 NOTE — Progress Notes (Signed)
[place] for heparin Indication: pulmonary embolus in setting of cardiac arrest >> code cool  Allergies  Allergen Reactions  . Amlodipine Other (See Comments)    Headaches  . Diltiazem Other (See Comments)    Headaches    Patient Measurements: Height: 5\' 7"  (170.2 cm) Weight: 220 lb 7.4 oz (100 kg) IBW/kg (Calculated) : 66.1 Heparin Dosing Weight: 90kg  Vital Signs: Temp: 91.6 F (33.1 C) (01/28 1900) Temp Source: Bladder (01/28 1900) BP: 92/56 (01/28 1900) Pulse Rate: 51 (01/28 1900)  Labs: Recent Labs    06/30/2018 2214  06/27/18 0024  06/27/18 0251  06/27/18 4970 06/27/18 0659 06/27/18 0800 06/27/18 0820 06/27/18 0824 06/27/18 0946 06/27/18 1203 06/27/18 1716  HGB 8.3*   < >  --    < >  --    < > 8.5*  --   --  7.7*  --   --   --  9.3*  HCT 31.8*   < >  --    < >  --    < > 25.0*  --   --  27.2*  --   --   --  31.3*  PLT 152  --   --   --   --   --   --   --   --  154  --   --   --   --   APTT  --   --  36  --  35  --   --   --   --   --  >200*  --   --   --   LABPROT  --   --  16.7*  --  17.9*  --   --   --   --   --  19.0*  --   --   --   INR  --   --  1.36  --  1.50  --   --   --   --   --  1.62  --   --   --   HEPARINUNFRC  --   --   --   --   --   --   --   --  0.41  --   --   --   --  0.54  CREATININE 1.36*  --  1.30*  --   --   --   --  1.23  --   --   --  1.28*  --  1.38*  TROPONINI  --    < > 0.07*  --   --   --   --  0.23*  --   --   --   --  0.23* 0.20*   < > = values in this interval not displayed.    Estimated Creatinine Clearance: 52.1 mL/min (A) (by C-G formula based on SCr of 1.38 mg/dL (H)).   Medical History: Past Medical History:  Diagnosis Date  . Diabetes mellitus without complication ([place])   . Glaucoma   . Hypertension   . Prostate cancer [facility])     Assessment: 78yo male presents after cardiac arrest, CT reveals PE w/ small clot burden though there is evidence of RHS >> on hypothermia  protocol.  Confirmatory heparin level remains therapeutic at 0.54.   No overt bleeding or complications noted.  Rewarming to begin at 03:30 AM.   Goal of Therapy:  Heparin level 0.3-0.7 units/ml Monitor platelets by anticoagulation protocol: Yes  Plan:  Continue IV heparin at current rate. Confirm heparin level in 8 hours - then q4-6 hours while rewarming Daily heparin level and CBC.  Sloan Leiter, PharmD, BCPS, BCCCP Clinical Pharmacist Please refer to [facility] for [place] numbers  06/27/2018 8:10 PM

## 2018-06-27 NOTE — Code Documentation (Signed)
CPR initiated

## 2018-06-28 ENCOUNTER — Inpatient Hospital Stay (HOSPITAL_COMMUNITY): Payer: Medicare Other

## 2018-06-28 DIAGNOSIS — I248 Other forms of acute ischemic heart disease: Secondary | ICD-10-CM

## 2018-06-28 DIAGNOSIS — J189 Pneumonia, unspecified organism: Secondary | ICD-10-CM

## 2018-06-28 DIAGNOSIS — I2699 Other pulmonary embolism without acute cor pulmonale: Secondary | ICD-10-CM

## 2018-06-28 DIAGNOSIS — I469 Cardiac arrest, cause unspecified: Secondary | ICD-10-CM

## 2018-06-28 LAB — POCT I-STAT 7, (LYTES, BLD GAS, ICA,H+H)
Acid-base deficit: 5 mmol/L — ABNORMAL HIGH (ref 0.0–2.0)
Acid-base deficit: 8 mmol/L — ABNORMAL HIGH (ref 0.0–2.0)
BICARBONATE: 17 mmol/L — AB (ref 20.0–28.0)
BICARBONATE: 16.7 mmol/L — AB (ref 20.0–28.0)
Calcium, Ion: 1.01 mmol/L — ABNORMAL LOW (ref 1.15–1.40)
Calcium, Ion: 1.06 mmol/L — ABNORMAL LOW (ref 1.15–1.40)
HCT: 33 % — ABNORMAL LOW (ref 39.0–52.0)
HCT: 32 % — ABNORMAL LOW (ref 39.0–52.0)
Hemoglobin: 11.2 g/dL — ABNORMAL LOW (ref 13.0–17.0)
Hemoglobin: 10.9 g/dL — ABNORMAL LOW (ref 13.0–17.0)
O2 Saturation: 100 %
O2 Saturation: 97 %
PCO2 ART: 22.9 mmHg — AB (ref 32.0–48.0)
PO2 ART: 79 mmHg — AB (ref 83.0–108.0)
Patient temperature: 98
Patient temperature: 34.9
Potassium: 3.2 mmol/L — ABNORMAL LOW (ref 3.5–5.1)
Potassium: 3.4 mmol/L — ABNORMAL LOW (ref 3.5–5.1)
Sodium: 135 mmol/L (ref 135–145)
Sodium: 137 mmol/L (ref 135–145)
TCO2: 18 mmol/L — ABNORMAL LOW (ref 22–32)
TCO2: 18 mmol/L — ABNORMAL LOW (ref 22–32)
pCO2 arterial: 26.9 mmHg — ABNORMAL LOW (ref 32.0–48.0)
pH, Arterial: 7.476 — ABNORMAL HIGH (ref 7.350–7.450)
pH, Arterial: 7.391 (ref 7.350–7.450)
pO2, Arterial: 196 mmHg — ABNORMAL HIGH (ref 83.0–108.0)

## 2018-06-28 LAB — TYPE AND SCREEN
ABO/RH(D): B POS
Antibody Screen: NEGATIVE
Unit division: 0

## 2018-06-28 LAB — BPAM RBC
Blood Product Expiration Date: 202002222359
ISSUE DATE / TIME: 202001281317
Unit Type and Rh: 7300

## 2018-06-28 LAB — POCT I-STAT 4, (NA,K, GLUC, HGB,HCT)
GLUCOSE: 125 mg/dL — AB (ref 70–99)
GLUCOSE: 128 mg/dL — AB (ref 70–99)
Glucose, Bld: 132 mg/dL — ABNORMAL HIGH (ref 70–99)
Glucose, Bld: 137 mg/dL — ABNORMAL HIGH (ref 70–99)
Glucose, Bld: 147 mg/dL — ABNORMAL HIGH (ref 70–99)
Glucose, Bld: 145 mg/dL — ABNORMAL HIGH (ref 70–99)
Glucose, Bld: 173 mg/dL — ABNORMAL HIGH (ref 70–99)
HCT: 31 % — ABNORMAL LOW (ref 39.0–52.0)
HCT: 31 % — ABNORMAL LOW (ref 39.0–52.0)
HCT: 33 % — ABNORMAL LOW (ref 39.0–52.0)
HCT: 33 % — ABNORMAL LOW (ref 39.0–52.0)
HCT: 36 % — ABNORMAL LOW (ref 39.0–52.0)
HCT: 37 % — ABNORMAL LOW (ref 39.0–52.0)
HEMATOCRIT: 33 % — AB (ref 39.0–52.0)
HEMOGLOBIN: 11.2 g/dL — AB (ref 13.0–17.0)
Hemoglobin: 10.5 g/dL — ABNORMAL LOW (ref 13.0–17.0)
Hemoglobin: 10.5 g/dL — ABNORMAL LOW (ref 13.0–17.0)
Hemoglobin: 11.2 g/dL — ABNORMAL LOW (ref 13.0–17.0)
Hemoglobin: 11.2 g/dL — ABNORMAL LOW (ref 13.0–17.0)
Hemoglobin: 12.2 g/dL — ABNORMAL LOW (ref 13.0–17.0)
Hemoglobin: 12.6 g/dL — ABNORMAL LOW (ref 13.0–17.0)
Potassium: 3.1 mmol/L — ABNORMAL LOW (ref 3.5–5.1)
Potassium: 3.4 mmol/L — ABNORMAL LOW (ref 3.5–5.1)
Potassium: 3.9 mmol/L (ref 3.5–5.1)
Potassium: 4.1 mmol/L (ref 3.5–5.1)
Potassium: 4.2 mmol/L (ref 3.5–5.1)
Potassium: 4.3 mmol/L (ref 3.5–5.1)
Potassium: 4.3 mmol/L (ref 3.5–5.1)
SODIUM: 136 mmol/L (ref 135–145)
Sodium: 137 mmol/L (ref 135–145)
Sodium: 136 mmol/L (ref 135–145)
Sodium: 136 mmol/L (ref 135–145)
Sodium: 135 mmol/L (ref 135–145)
Sodium: 136 mmol/L (ref 135–145)
Sodium: 136 mmol/L (ref 135–145)

## 2018-06-28 LAB — COMPREHENSIVE METABOLIC PANEL
ALT: 45 U/L — ABNORMAL HIGH (ref 0–44)
AST: 58 U/L — ABNORMAL HIGH (ref 15–41)
Albumin: 2.8 g/dL — ABNORMAL LOW (ref 3.5–5.0)
Alkaline Phosphatase: 83 U/L (ref 38–126)
Anion gap: 15 (ref 5–15)
BUN: 24 mg/dL — ABNORMAL HIGH (ref 8–23)
CHLORIDE: 104 mmol/L (ref 98–111)
CO2: 15 mmol/L — ABNORMAL LOW (ref 22–32)
Calcium: 7.8 mg/dL — ABNORMAL LOW (ref 8.9–10.3)
Creatinine, Ser: 1.57 mg/dL — ABNORMAL HIGH (ref 0.61–1.24)
GFR calc Af Amer: 49 mL/min — ABNORMAL LOW (ref >60)
GFR, EST NON AFRICAN AMERICAN: 42 mL/min — AB (ref >60)
Glucose, Bld: 169 mg/dL — ABNORMAL HIGH (ref 70–99)
Potassium: 3.2 mmol/L — ABNORMAL LOW (ref 3.5–5.1)
Sodium: 134 mmol/L — ABNORMAL LOW (ref 135–145)
Total Bilirubin: 2.2 mg/dL — ABNORMAL HIGH (ref 0.3–1.2)
Total Protein: 5.4 g/dL — ABNORMAL LOW (ref 6.5–8.1)

## 2018-06-28 LAB — HEPARIN LEVEL (UNFRACTIONATED)
HEPARIN UNFRACTIONATED: 0.43 [IU]/mL (ref 0.30–0.70)
Heparin Unfractionated: 0.54 IU/mL (ref 0.30–0.70)
Heparin Unfractionated: <0.1 IU/mL — ABNORMAL LOW (ref 0.30–0.70)

## 2018-06-28 LAB — MAGNESIUM
Magnesium: 1.8 mg/dL (ref 1.7–2.4)
Magnesium: 2.1 mg/dL (ref 1.7–2.4)
Magnesium: 2.1 mg/dL (ref 1.7–2.4)

## 2018-06-28 LAB — GLUCOSE, CAPILLARY
GLUCOSE-CAPILLARY: 179 mg/dL — AB (ref 70–99)
GLUCOSE-CAPILLARY: 179 mg/dL — AB (ref 70–99)
GLUCOSE-CAPILLARY: 160 mg/dL — AB (ref 70–99)
Glucose-Capillary: 104 mg/dL — ABNORMAL HIGH (ref 70–99)
Glucose-Capillary: 114 mg/dL — ABNORMAL HIGH (ref 70–99)
Glucose-Capillary: 127 mg/dL — ABNORMAL HIGH (ref 70–99)

## 2018-06-28 LAB — ECHOCARDIOGRAM COMPLETE
HEIGHTINCHES: 67 in
Weight: 3527.36 oz

## 2018-06-28 LAB — PHOSPHORUS
PHOSPHORUS: 4 mg/dL (ref 2.5–4.6)
Phosphorus: 3.6 mg/dL (ref 2.5–4.6)
Phosphorus: 4.5 mg/dL (ref 2.5–4.6)

## 2018-06-28 LAB — TRIGLYCERIDES: Triglycerides: 88 mg/dL (ref <150)

## 2018-06-28 MED ORDER — MAGNESIUM SULFATE 2 GM/50ML IV SOLN
2.0000 g | Freq: Once | INTRAVENOUS | Status: AC
  Administered 2018-06-28: 2 g via INTRAVENOUS
  Filled 2018-06-28: qty 50

## 2018-06-28 MED ORDER — CALCIUM GLUCONATE-NACL 1-0.675 GM/50ML-% IV SOLN
1.0000 g | Freq: Once | INTRAVENOUS | Status: AC
  Administered 2018-06-28: 1000 mg via INTRAVENOUS
  Filled 2018-06-28: qty 50

## 2018-06-28 MED ORDER — PRO-STAT SUGAR FREE PO LIQD
60.0000 mL | Freq: Three times a day (TID) | ORAL | Status: DC
  Administered 2018-06-28 – 2018-06-29 (×6): 60 mL
  Filled 2018-06-28 (×6): qty 60

## 2018-06-28 MED ORDER — POTASSIUM CHLORIDE 10 MEQ/100ML IV SOLN
10.0000 meq | INTRAVENOUS | Status: AC
  Administered 2018-06-28 (×3): 10 meq via INTRAVENOUS
  Filled 2018-06-28 (×2): qty 100

## 2018-06-28 MED ORDER — POTASSIUM CHLORIDE 10 MEQ/100ML IV SOLN: 10.0000 meq | INTRAVENOUS | Status: DC

## 2018-06-28 MED ORDER — VITAL HIGH PROTEIN PO LIQD
1000.0000 mL | ORAL | Status: DC
  Administered 2018-06-28 – 2018-06-30 (×3): 1000 mL

## 2018-06-28 NOTE — Plan of Care (Signed)
  Problem: Cardiac: Goal: Ability to achieve and maintain adequate cardiopulmonary perfusion will improve Outcome: Progressing   Problem: Neurologic: Goal: Promote progressive neurologic recovery Outcome: Progressing   Problem: Skin Integrity: Goal: Risk for impaired skin integrity will be minimized. Outcome: Progressing   Problem: Clinical Measurements: Goal: Will remain free from infection Outcome: Progressing   Problem: Safety: Goal: Ability to remain free from injury will improve Outcome: Progressing   Problem: Skin Integrity: Goal: Risk for impaired skin integrity will decrease Outcome: Progressing

## 2018-06-28 NOTE — Progress Notes (Signed)
[place] for Heparin Indication: pulmonary embolus in setting of cardiac arrest, s/p code cool  Allergies  Allergen Reactions  . Amlodipine Other (See Comments)    Headaches  . Diltiazem Other (See Comments)    Headaches    Patient Measurements: Height: 5\' 9"  (175.3 cm) Weight: 220 lb 7.4 oz (100 kg) IBW/kg (Calculated) : 70.7 Heparin Dosing Weight: 90kg  Vital Signs: Temp: 98.2 F (36.8 C) (01/29 1900) BP: 113/73 (01/29 2215) Pulse Rate: 107 (01/29 2215)  Labs: Recent Labs    06/08/2018 2214  06/27/18 0024  06/27/18 0251  06/27/18 0659  06/27/18 0820 06/27/18 0824  06/27/18 1203 06/27/18 1716 06/27/18 2008 06/28/18 0130  06/28/18 0501 06/28/18 0800  06/28/18 1804 06/28/18 2001 06/28/18 2225 06/28/18 2232  HGB 8.3*   < >  --    < >  --    < >  --   --  7.7*  --   --   --  9.3*  --   --    < >  --   --    < > 11.2* 12.2*  --  12.6*  HCT 31.8*   < >  --    < >  --    < >  --   --  27.2*  --   --   --  31.3*  --   --    < >  --   --    < > 33.0* 36.0*  --  37.0*  PLT 152  --   --   --   --   --   --   --  154  --   --   --   --   --   --   --   --   --   --   --   --   --   --   APTT  --   --  36  --  35  --   --   --   --  >200*  --   --   --   --   --   --   --   --   --   --   --   --   --   LABPROT  --   --  16.7*  --  17.9*  --   --   --   --  19.0*  --   --   --   --   --   --   --   --   --   --   --   --   --   INR  --   --  1.36  --  1.50  --   --   --   --  1.62  --   --   --   --   --   --   --   --   --   --   --   --   --   HEPARINUNFRC  --   --   --   --   --   --   --    < >  --   --   --   --  0.54  --  0.54  --   --  0.43  --   --   --  <0.10*  --   CREATININE 1.36*  --  1.30*  --   --   --  1.23  --   --   --    < >  --  1.38* 1.37*  --   --  1.57*  --   --   --   --   --   --   TROPONINI  --    < > 0.07*  --   --   --  0.23*  --   --   --   --  0.23* 0.20*  --   --   --   --   --   --   --   --   --   --    < >  = values in this interval not displayed.    Estimated Creatinine Clearance: 47.4 mL/min (A) (by C-G formula based on SCr of 1.57 mg/dL (H)).   Medical History: Past Medical History:  Diagnosis Date  . Diabetes mellitus without complication ([place])   . Glaucoma   . Hypertension   . Prostate cancer [facility])     Assessment: 78yo male presents after cardiac arrest, CT reveals PE w/ small clot burden though there is evidence of RHS >> on hypothermia protocol.  1/29 PM update: Heparin level sub-therapeutic after re-start this afternoon, no issues per RN.   Goal of Therapy:  Heparin level 0.3-0.7 units/ml Monitor platelets by anticoagulation protocol: Yes   Plan:  Inc heparin to 1150 units/hr Re-check heparin level in 6 hours  Narda Bonds, PharmD, [place] Pharmacist Phone: [phone]

## 2018-06-28 NOTE — Progress Notes (Signed)
[place] for Heparin Indication: pulmonary embolus in setting of cardiac arrest >> code cool  Allergies  Allergen Reactions  . Amlodipine Other (See Comments)    Headaches  . Diltiazem Other (See Comments)    Headaches    Patient Measurements: Height: 5\' 7"  (170.2 cm) Weight: 220 lb 7.4 oz (100 kg) IBW/kg (Calculated) : 66.1 Heparin Dosing Weight: 90kg  Vital Signs: Temp: 92.8 F (33.8 C) (01/29 0700) Temp Source: Bladder (01/29 0700) BP: 100/46 (01/29 0900) Pulse Rate: 77 (01/29 0900)  Labs: Recent Labs    06/25/2018 2214  06/27/18 0024  06/27/18 0251  06/27/18 0659  06/27/18 0820 06/27/18 0824  06/27/18 1203 06/27/18 1716 06/27/18 2008 06/28/18 0130 06/28/18 0430 06/28/18 0501 06/28/18 0800  HGB 8.3*   < >  --    < >  --    < >  --   --  7.7*  --   --   --  9.3*  --   --  11.2*  --   --   HCT 31.8*   < >  --    < >  --    < >  --   --  27.2*  --   --   --  31.3*  --   --  33.0*  --   --   PLT 152  --   --   --   --   --   --   --  154  --   --   --   --   --   --   --   --   --   APTT  --   --  36  --  35  --   --   --   --  >200*  --   --   --   --   --   --   --   --   LABPROT  --   --  16.7*  --  17.9*  --   --   --   --  19.0*  --   --   --   --   --   --   --   --   INR  --   --  1.36  --  1.50  --   --   --   --  1.62  --   --   --   --   --   --   --   --   HEPARINUNFRC  --   --   --   --   --   --   --    < >  --   --   --   --  0.54  --  0.54  --   --  0.43  CREATININE 1.36*  --  1.30*  --   --   --  1.23  --   --   --    < >  --  1.38* 1.37*  --   --  1.57*  --   TROPONINI  --    < > 0.07*  --   --   --  0.23*  --   --   --   --  0.23* 0.20*  --   --   --   --   --    < > = values in this interval not displayed.    Estimated Creatinine Clearance: 45.8 mL/min (  A) (by C-G formula based on SCr of 1.57 mg/dL (H)).   Medical History: Past Medical History:  Diagnosis Date  . Diabetes mellitus without complication  ([place])   . Glaucoma   . Hypertension   . Prostate cancer [facility])     Assessment: 78yo male presents after cardiac arrest, CT reveals PE w/ small clot burden though there is evidence of RHS >> on hypothermia protocol. Rewarming begun 1/29 at 3:30 AM.  Heparin level remains therapeutic but slightly decreased to 0.43 at 0800 this AM; expect level to decrease while rewarming. No issues with line or overt bleeding per RN. H&H, pltc stable.  Goal of Therapy:  Heparin level 0.3-0.7 units/ml Monitor platelets by anticoagulation protocol: Yes   Plan:  Increase heparin to 1000 units/hr. Re-check heparin level at 1400 - more frequent checks during re-warming period. Continue monitoring H&H, platelets, s/sx bleeding.    Oralia Manis Student Pharmacist

## 2018-06-28 NOTE — Progress Notes (Signed)
  Echocardiogram 2D Echocardiogram has been performed.  Christian Fry 06/28/2018, 9:57 AM

## 2018-06-28 NOTE — Procedures (Signed)
Central Venous Catheter Insertion Procedure Note Shadd Dunstan [id number] Jan 10, 1941  Procedure: Insertion of Central Venous Catheter Indications: Assessment of intravascular volume, Drug and/or fluid administration and Frequent blood sampling  Procedure Details Consent: Risks of procedure as well as the alternatives and risks of each were explained to the (patient/caregiver).  Consent for procedure obtained. Time Out: Verified patient identification, verified procedure, site/side was marked, verified correct patient position, special equipment/implants available, medications/allergies/relevent history reviewed, required imaging and test results available.  Performed  Maximum sterile technique was used including antiseptics, cap, gloves and gown.hat, mask Skin prep: Chlorhexidine; local anesthetic administered A antimicrobial bonded/coated triple lumen catheter was placed in the left internal jugular vein using the Seldinger technique.  Evaluation Blood flow good Complications: No apparent complications Patient did tolerate procedure well. Chest X-ray ordered to verify placement.  CXR: pending.  U/S used in placement  YACOUB,WESAM 06/28/2018, 2:26 PM

## 2018-06-28 NOTE — Consult Note (Addendum)
Neurology Consultation  Reason for Consult: Prognostication Referring Physician: Nelda Marseille  CC: Prognostication after cardiac arrest  History is obtained from: Chart  HPI: Christian Fry is a 78 y.o. male who was admitted 1/27 after witnessed cardiac arrest with 23 minutes of resuscitation possibly longer and ROSC.  Per chart, he was driving with a friend when he began to feel unwell then lost consciousness, his friend brought him to the fire station where they did not appreciate a pulse and started CPR.  AED was placed and did not recommend shock.  ROSC was achieved after a dose of epinephrine.  He lost pulse again in route to the ED with ROSC after 5 minutes.  Patient also noted to have and PE  On arrival to the hospital PCO2 55, PO2 75, bicarb 17, glucose 279, BNP 517. AST 64 ALT 38 BUN 22 Creatinine 1.36 EEG obtained on 06/27/2018 showed abnormal EEG due to burst suppression pattern seen throughout the tracing.  Most likely secondary to medications.   Past medical history does include prostate cancer, hypertension, diabetes.  Patient currently being rewarmed at this time.   ROS: Unable to obtain due to altered mental status.   Past Medical History:  Diagnosis Date  . Diabetes mellitus without complication ([place])   . Glaucoma   . Hypertension   . Prostate cancer [facility])     Family History  Problem Relation Age of Onset  . Diabetes Mother   . Heart attack Father    Social History:   reports that he has never smoked. He has never used smokeless tobacco. He reports that he does not drink alcohol or use drugs.  Medications  Current Facility-Administered Medications:  .  0.9 %  sodium chloride infusion (Manually program via Guardrails IV Fluids), , Intravenous, Once, Ledell Noss, MD, Stopped at 06/27/18 1151 .  0.9 %  sodium chloride infusion, , Intravenous, PRN, Anders Simmonds, MD, Last Rate: 10 mL/hr at 06/28/18 0928, 500 mL at 06/28/18 0928 .  amiodarone (NEXTERONE PREMIX)  360-4.14 MG/200ML-% (1.8 mg/mL) IV infusion, 30 mg/hr, Intravenous, Continuous, Rush Farmer, MD, Last Rate: 16.67 mL/hr at 06/28/18 1000, 30 mg/hr at 06/28/18 1000 .  artificial tears (LACRILUBE) ophthalmic ointment 1 application, 1 application, Both Eyes, Q8H, Sood, Vineet, MD, 1 application at 51/02/58 0610 .  aspirin chewable tablet 81 mg, 81 mg, Per Tube, Daily, Anders Simmonds, MD, 81 mg at 06/28/18 0932 .  azithromycin (ZITHROMAX) 500 mg in sodium chloride 0.9 % 250 mL IVPB, 500 mg, Intravenous, Q24H, Chesley Mires, MD, Stopped at 06/28/18 0104 .  calcium gluconate 1 g/ 50 mL sodium chloride IVPB, 1 g, Intravenous, Once, Ledell Noss, MD, Last Rate: 50 mL/hr at 06/28/18 1000 .  cefTRIAXone (ROCEPHIN) 1 g in sodium chloride 0.9 % 100 mL IVPB, 1 g, Intravenous, Q24H, Chesley Mires, MD, Stopped at 06/28/18 0204 .  chlorhexidine gluconate (MEDLINE KIT) (PERIDEX) 0.12 % solution 15 mL, 15 mL, Mouth Rinse, BID, Rush Farmer, MD, 15 mL at 06/28/18 0827 .  cisatracurium (NIMBEX) bolus via infusion 10 mg, 0.1 mg/kg, Intravenous, Once **AND** cisatracurium (NIMBEX) 200 mg in sodium chloride 0.9 % 200 mL (1 mg/mL) infusion, 1-1.5 mcg/kg/min, Intravenous, Continuous, Last Rate: 9 mL/hr at 06/28/18 1000, 1.5 mcg/kg/min at 06/28/18 1000 **AND** [COMPLETED] cisatracurium (NIMBEX) bolus via infusion 5 mg, 0.05 mg/kg, Intravenous, PRN, Chesley Mires, MD, 5 mg at 06/27/18 0353 .  fentaNYL (SUBLIMAZE) bolus via infusion 25 mcg, 25 mcg, Intravenous, Q30 min PRN, Chesley Mires, MD .  fentaNYL (SUBLIMAZE) injection 50 mcg, 50 mcg, Intravenous, Once, Sood, Vineet, MD .  fentaNYL 2576mg in NS 2544m(1048mml) infusion-PREMIX, 100-300 mcg/hr, Intravenous, Continuous, Sood, Vineet, MD, Last Rate: 17.5 mL/hr at 06/28/18 1000, 175 mcg/hr at 06/28/18 1000 .  heparin ADULT infusion 100 units/mL (25000 units/250m35mdium chloride 0.45%), 1,000 Units/hr, Intravenous, Continuous, YacoRush Farmer, Last Rate: 10 mL/hr at  06/28/18 1000, 1,000 Units/hr at 06/28/18 1000 .  insulin aspart (novoLOG) injection 3-9 Units, 3-9 Units, Subcutaneous, Q4H, SoodChesley Mires, 3 Units at 06/28/18 0827 .  MEDLINE mouth rinse, 15 mL, Mouth Rinse, 10 times per day, YacoRush Farmer, 15 mL at 06/28/18 0938 .  norepinephrine (LEVOPHED) 4mg 29mNS 250mL 52mix infusion, 0-50 mcg/min, Intravenous, Titrated, Sood, Vineet, MD, Last Rate: 45 mL/hr at 06/28/18 1000, 12 mcg/min at 06/28/18 1000 .  pantoprazole (PROTONIX) injection 40 mg, 40 mg, Intravenous, QHS, Sood, Vineet, MD, 40 mg at 06/27/18 2217 .  potassium chloride 10 mEq in 100 mL IVPB, 10 mEq, Intravenous, Q1 Hr x 3, Blum, Ledell NossLast Rate: 100 mL/hr at 06/28/18 1000 .  propofol (DIPRIVAN) 1000 MG/100ML infusion, 25-80 mcg/kg/min, Intravenous, Continuous, Sood, Chesley MiresLast Rate: 18 mL/hr at 06/28/18 1000, 30 mcg/kg/min at 06/28/18 1000   Exam: Current vital signs: BP 98/62   Pulse 75   Temp (!) 92.8 F (33.8 C) (Bladder)   Resp (!) 24   Ht '5\' 7"'  (1.702 m)   Wt 100 kg   SpO2 100%   BMI 34.53 kg/m  Vital signs in last 24 hours: Temp:  [91 F (32.8 C)-92.8 F (33.8 C)] 92.8 F (33.8 C) (01/29 0700) Pulse Rate:  [51-106] 75 (01/29 1000) Resp:  [22-24] 24 (01/29 1000) BP: (86-149)/(46-98) 98/62 (01/29 1000) SpO2:  [99 %-100 %] 100 % (01/29 1000) Arterial Line BP: (85-146)/(45-69) 117/56 (01/29 1000) FiO2 (%):  [40 %-90 %] 40 % (01/29 1013)  Physical Exam  Constitutional: Appears well-developed and well-nourished.  Psych: Intubated and sedated Eyes: No scleral injection HENT: No OP obstrucion Head: Normocephalic.  Cardiovascular: Normal rate and regular rhythm.  Respiratory: Intubated and not breathing over the ventilator GI: Soft.  No distension. There is no tenderness.  Skin: WDI  Neuro: Mental Status: Patient does not respond to verbal stimuli.  Does not respond to deep sternal rub.  Does not follow commands.  No verbalizations noted.  Intubated  and not breathing over the ventilator Cranial Nerves: II: patient does not respond confrontation bilaterally,  III,IV,VI: doll's response absent bilaterally. pupils right 1 mm, left 1 mm,and nonreactive bilaterally V,VII: corneal reflex absent bilaterally  VIII: patient does not respond to verbal stimuli IX,X: gag reflex present, XI: trapezius strength unable to test bilaterally XII: tongue strength unable to test Motor: Extremities flaccid throughout.  No spontaneous movement noted.  No purposeful movements noted. Sensory: Does not respond to noxious stimuli in any extremity. Deep Tendon Reflexes:  Absent throughout. Plantars: absent bilaterally Cerebellar: Unable to perform  Labs I have reviewed labs in epic and the results pertinent to this consultation are:  CBC    Component Value Date/Time   WBC 11.3 (H) 06/27/2018 0820   RBC 3.55 (L) 06/27/2018 0820   HGB 10.5 (L) 06/28/2018 1000   HCT 31.0 (L) 06/28/2018 1000   PLT 154 06/27/2018 0820   MCV 76.6 (L) 06/27/2018 0820   MCH 21.7 (L) 06/27/2018 0820   MCHC 28.3 (L) 06/27/2018 0820   RDW 18.3 (H) 06/27/2018 0820   LYMPHSABS 3.2  06/29/2018 2214   MONOABS 0.2 06/06/2018 2214   EOSABS 0.0 06/19/2018 2214   BASOSABS 0.0 06/21/2018 2214    CMP     Component Value Date/Time   NA 136 06/28/2018 1000   K 3.4 (L) 06/28/2018 1000   CL 104 06/28/2018 0501   CO2 15 (L) 06/28/2018 0501   GLUCOSE 125 (H) 06/28/2018 1000   BUN 24 (H) 06/28/2018 0501   CREATININE 1.57 (H) 06/28/2018 0501   CALCIUM 7.8 (L) 06/28/2018 0501   PROT 5.4 (L) 06/28/2018 0501   ALBUMIN 2.8 (L) 06/28/2018 0501   AST 58 (H) 06/28/2018 0501   ALT 45 (H) 06/28/2018 0501   ALKPHOS 83 06/28/2018 0501   BILITOT 2.2 (H) 06/28/2018 0501   GFRNONAA 42 (L) 06/28/2018 0501   GFRAA 49 (L) 06/28/2018 0501    Lipid Panel     Component Value Date/Time   TRIG 88 06/28/2018 0501     Imaging I have reviewed the images obtained:  CT-scan of the brain--no  acute intracranial abnormality on CT obtained on 06/15/2018  CT angios chest positive for acute PE with left upper lobe thrombus  Etta Quill PA-C [facility] [phone]  M-F  (9:00 am- 5:00 PM)  06/28/2018, 10:24 AM   Attending addendum Patient seen and examined. Currently paralyzed on Nimbex and on sedation with fentanyl and Versed. Currently being rewarmed and not normothermic yet. Exam limited due to hypothermia as well as chemical paralysis.   Assessment:  78 year old male with PEA arrest with ROSC after approximately 23 minutes however could have been longer.  In addition found to have PE left upper lobe.  Currently sedated with fentanyl and Versed and paralyzed with Nimbex as he is being weaned off his hypothermia protocol.  CT head negative for acute processes--however this was obtained on day of cardiac arrest thus likely too early to show any abnormalities.  EEG showed burst suppression  Although too early to prognosticate, long duration of PEA arrest prior to return spontaneous circulation, advanced age, comorbidities such as PE as well as the fact that the EEG showed burst suppression so early into the course usually portends poor prognosis.  Impression: - PE -PEA arrest -hypoxic anoxic encephalopathy   Recommendations: -Continue rewarming - We will perform another examination after the sedation is off and paralytics have worn off and he is rewarmed. -obtain MRI without contrast when able -Repeat EEG tomorrow --will continue to follow  -- Amie Portland, MD [facility] Pager: [phone] If 7pm to 7am, please call on call as listed on AMION.  CRITICAL CARE ATTESTATION Performed by: Amie Portland, MD Total critical care time: 30 minutes Critical care time was exclusive of separately billable procedures and treating other patients and/or supervising APPs/Residents/Students Critical care was necessary to treat or prevent imminent or  life-threatening deterioration due to PEA cardiac arrest, hypoxic ischemic encephalopathy. This patient is critically ill and at significant risk for neurological worsening and/or death and care requires constant monitoring. Critical care was time spent personally by me on the following activities: development of treatment plan with patient and/or surrogate as well as nursing, discussions with consultants, evaluation of patient's response to treatment, examination of patient, obtaining history from patient or surrogate, ordering and performing treatments and interventions, ordering and review of laboratory studies, ordering and review of radiographic studies, pulse oximetry, re-evaluation of patient's condition, participation in multidisciplinary rounds and medical decision making of high complexity in the care of this patient.

## 2018-06-28 NOTE — Progress Notes (Signed)
Initial Nutrition Assessment  DOCUMENTATION CODES:   Not applicable  INTERVENTION:   -Vital High Protein @ 30 ml/hr (720 ml) via OGT -Liquid MVI daily -60 ml Prostat TID  Provides: 1320 kcals (1795 kcal with propofol), 153 grams protein, 602 ml free water.    NUTRITION DIAGNOSIS:   Increased nutrient needs related to acute illness as evidenced by estimated needs.  GOAL:   Provide needs based on ASPEN/SCCM guidelines  MONITOR:   Diet advancement, Vent status, TF tolerance, Weight trends, Labs, I & O's  REASON FOR ASSESSMENT:   Consult Enteral/tube feeding initiation and management  ASSESSMENT:   Patient with PMH significant for DM, prostate cancer, and HTN. Presents this admission after witnessed PEA/asystolic arrest (23 minutes prior to [place]). Found to have NSTEMI, PE 2/2 to acute vein thrombosis, and CAP.    Pt currently rewarming. Continues on Nimbex. Per CCM, okay to start TF.   Spoke with brother at bedside. Pt's appetite slowly declined over the last two years after he lost his wife and mother. He typically eats a light breakfast with a heavier lunch and dinner. Brother unable to give meal examples. Brother denies pt had unintentional wt loss PTA, unsure of UBW.   Weight records show a stated weight of 107 kg at [facility] on 05/11/18 and an actual weight recording of 100 kg this admission (will utilize this to estimate needs). Pt shows to have +3 pitting edema in bilateral lower extremites.   Patient is currently intubated on ventilator support MV: 8.0 L/min Temp (24hrs), Avg:91.6 F (33.1 C), Min:91.2 F (32.9 C), Max:92.8 F (33.8 C) BP: 112/53 MAP: 71- A-line Propofol: 18 ml/hr- provides 475 kcal   I/O: +5.5 L since admit UOP: 490 ml x 24 hrs   Medications reviewed and include: nimbex, SSI, amiodarone, levophed, propofol Labs reviewed: K 3.4 (L) calcium ionized 1.01 (L) corrected calcium 8.8 (L)  NUTRITION - FOCUSED PHYSICAL EXAM:    Most Recent Value   Orbital Region  No depletion  Upper Arm Region  No depletion  Thoracic and Lumbar Region  Unable to assess  Buccal Region  No depletion  Temple Region  No depletion  Clavicle Bone Region  No depletion  Clavicle and Acromion Bone Region  No depletion  Scapular Bone Region  Unable to assess  Dorsal Hand  No depletion  Patellar Region  No depletion  Anterior Thigh Region  No depletion  Posterior Calf Region  No depletion  Edema (RD Assessment)  Moderate [BLE]  Hair  Reviewed  Eyes  Unable to assess  Mouth  Unable to assess  Skin  Reviewed  Nails  Reviewed     Diet Order:   Diet Order            Diet NPO time specified  Diet effective now              EDUCATION NEEDS:   Not appropriate for education at this time  Skin:  Skin Assessment: Reviewed RN Assessment  Last BM:  PTA  Height:   Ht Readings from Last 1 Encounters:  06/28/18 5\' 9"  (1.753 m)    Weight:   Wt Readings from Last 1 Encounters:  06/10/2018 100 kg    Ideal Body Weight:  72.7 kg  BMI:  Body mass index is 32.56 kg/m.  Estimated Nutritional Needs:   Kcal:  1100-1400 kcal  Protein:  145-165 grams  Fluid:  >/= 1.4 L/day   Mariana Single RD, LDN Clinical Nutrition Pager # -  [phone]  

## 2018-06-28 NOTE — Progress Notes (Signed)
Called and notified pharmacy that pt has begun to re-warm at 0330 per protocol.

## 2018-06-28 NOTE — Progress Notes (Addendum)
NAME:  Christian Fry, MRN:  [id number], DOB:  Mar 16, 1941, LOS: 1 ADMISSION DATE:  05/31/2018, CONSULTATION DATE:1/27 REFERRING MD:Dr. Dayna Barker, CHIEF COMPLAINT:Cardiac arrest  Brief History   78 year old woman who was admit 1/27 after witnessed cardiac arrest with 23 minutes of resuscitation prior to [place]. He was driving with a friend when he began to feel unwell then lost consciousness, his friend brought him to a fire station where they did not appreciate a pulse and started CPR. An AED was placed and did not recommend shock. ROSC was achieved after one dose of epinephrine. He lost pulses again en route to the ED with ROSC after 5 minutes. He was found to have RLL PNA and PE with RV strain. His labwork revealed pH 7.11, pCO2 55, pO2 75, bicarb 17 on blood gas, trop 0.07, Glucose 279, Ca 8.1, BNP 517, WBC 12 and hemoglobin 8.  Family described months of progressive SOB and lower extremity edema.   Past Medical History  Diabetes Mellitus  Hypertension   Significant Hospital Events   1/27 admit after witnessed cardiac arrest with ROSC after 23 minutes.   Consults:    Procedures:  ETT 1/28 > Arterial line 1/28>   Significant Diagnostic Tests:  CTA chest 1/27 >Positive for acute PE with left upper lobe thrombus. While clot burden is small to moderate, there is CT evidence of right heart strain (RV/LV Ratio = 2.9).Near complete consolidation throughout the right lower lobe. Dense consolidation in the perihilar left lower lobe with surrounding ground-glass opacities. CT head 1/27 >No acute intracranial abnormality. Echo 1/28 >  Micro Data:  Blood Culture 1/27 > ngtd  Sputum 1/27 > few GPC on gram stain, culture pending  RVP 1/28 > Neg  Strep pneumoniae 1/28 > neg  MRSA neg   Antimicrobials:  Zosyn Vancomycin 1/27 in ED Ceftriaxone 1/28 > Azithromycin 1/28 >  Interim history/subjective:  Cardiology consulted yesterday, will consider cardiac catheterization if he  regains neurologic function. Cardiology feels that the elevated troponin is consitent with demand ischemia or myocardial injury related to the PE and cardiac arrest.   Objective   Blood pressure 93/60, pulse (!) 106, temperature (!) 92.8 F (33.8 C), temperature source Bladder, resp. rate (!) 24, height 5\' 7"  (1.702 m), weight 100 kg, SpO2 100 %.    Vent Mode: PRVC FiO2 (%):  [40 %-90 %] 40 % Set Rate:  [24 bmp] 24 bmp Vt Set:  [530 mL] 530 mL PEEP:  [8 cmH20] 8 cmH20 Plateau Pressure:  [20 cmH20-22 cmH20] 20 cmH20   Intake/Output Summary (Last 24 hours) at 06/28/2018 7517 Last data filed at 06/28/2018 0800 Gross per 24 hour  Intake 4262.52 ml  Output 465 ml  Net 3797.52 ml   Filed Weights   06/18/2018 2309  Weight: 100 kg    Examination: General: ill appearing  HENT: ETT, OGT  Lungs: course breath sounds, reduced breath sounds at the bases, no wheezes  Cardiovascular: regular rate and rhythm, no mumrur 2+ lower extremity edema  Abdomen: cooling pads in place over the abdomen, soft, non distended, no epigastric tenderness   Extremities: 2+ bl lower extremity edema  Neuro: Pupils are fixed, no response to suctioning of the oropharynx, does not open eyes to voice or painful stimulus  GU: urethral catheter in place   Resolved Hospital Problem list     Assessment & Plan:  Post Cardiac arrest  Shock, cardiogenic vs septic  NSTEMI  Lactic acidosis  Acute anoxic encephalopathy  He was  a PEA/asystolic arrest for his first 3 periods of arrest, then had once short arrest of VT requiring cardioversion and amiodarone. EEG yesterday is consistent with burst supression  - rewarm  - levophed to keep map > 65  - amiodarone  - asa 81, hold off on bblocker and ace/arb given shock and AKI   Acute hypoxic respiratory failure secondary to pulmonary embolism and post cardiac arrest  Acute Hypercarbic respiratory acidosis  Pulmonary embolism 2/2 acute deep vein thrombosis involving the left  posterior tibial vein Community acquired PNA  - full vent support  fentanyl gtt, propofol gtt, prn fentanyl  - heparin gtt  - ctx and azithromycin  - follow up echo   AKI - non oliguric  Metabolic acidosis  S/p 1 L NS bolus - stop IV fluids  - monitor renal function and urine output   Hypokalemia  - replacing 10 meq x3   Hypocalcemia  - calcium gluconate x2  - follow up ionized ca   Hypomagnesemia  -provide 2g mag  - follow up tomorrow   Diabetes Mellitus  - SSI and CBG   Normocytic anemia Iron deficiency ( ferritin 12 in care everywhere from 04/2018)   - hemoglobin 11 this morning, transfused one unit  - transfuse for hemoglobin < 8   HTN  - hold home HCTZ   Best practice:  Diet: start tube feeds  Pain/Anxiety/Delirium protocol (if indicated): fentanyl gtt, propofol gtt, prn fentanyl  VAP protocol (if indicated): yes DVT prophylaxis: heparin gtt  GI prophylaxis: IV protonix 40 mg qd  Glucose control: SSI Mobility: bed rest  Code Status: full Family Communication: updated at bedside yesterday, will update today as we rewarm  Disposition: guarded, will be able to better prognosticate after rewarming   Labs   CBC: Recent Labs  Lab 06/02/2018 2214  06/27/18 0448 06/27/18 0649 06/27/18 0820 06/27/18 1716 06/28/18 0430  WBC 12.2*  --   --   --  11.3*  --   --   NEUTROABS 8.7*  --   --   --   --   --   --   HGB 8.3*   < > 8.8* 8.5* 7.7* 9.3* 11.2*  HCT 31.8*   < > 26.0* 25.0* 27.2* 31.3* 33.0*  MCV 84.1  --   --   --  76.6*  --   --   PLT 152  --   --   --  154  --   --    < > = values in this interval not displayed.    Basic Metabolic Panel: Recent Labs  Lab 06/25/2018 2214  06/27/18 0659 06/27/18 0946 06/27/18 1203 06/27/18 1716 06/27/18 2008 06/28/18 0430 06/28/18 0501  NA 135   < > 133* 135  --  134* 134* 135 134*  K 3.5   < > 3.0* 3.0* 3.1* 3.2* 3.3* 3.2* 3.2*  CL 104   < > 101 105  --  106 106  --  104  CO2 14*   < > 17* 17*  --  17*  16*  --  15*  GLUCOSE 279*   < > 204* 187*  --  114* 116*  --  169*  BUN 22   < > 23 24*  --  23 23  --  24*  CREATININE 1.36*   < > 1.23 1.28*  --  1.38* 1.37*  --  1.57*  CALCIUM 8.1*   < > 7.8* 8.4*  --  8.1* 8.0*  --  7.8*  MG 1.8  --  1.4*  --   --   --   --   --  1.8  PHOS  --   --  3.3  --   --   --   --   --  3.6   < > = values in this interval not displayed.   GFR: Estimated Creatinine Clearance: 45.8 mL/min (A) (by C-G formula based on SCr of 1.57 mg/dL (H)). Recent Labs  Lab 06/04/2018 2214 06/27/18 0006 06/27/18 0228 06/27/18 0659 06/27/18 0820  WBC 12.2*  --   --   --  11.3*  LATICACIDVEN  --  6.1* 2.5* 2.3*  --     Liver Function Tests: Recent Labs  Lab 06/27/2018 2214 06/28/18 0501  AST 64* 58*  ALT 38 45*  ALKPHOS 100 83  BILITOT 1.3* 2.2*  PROT 6.7 5.4*  ALBUMIN 3.5 2.8*   Recent Labs  Lab 06/12/2018 2214  LIPASE 30   No results for input(s): AMMONIA in the last 168 hours.  ABG    Component Value Date/Time   PHART 7.476 (H) 06/28/2018 0430   PCO2ART 22.9 (L) 06/28/2018 0430   PO2ART 196.0 (H) 06/28/2018 0430   HCO3 17.0 (L) 06/28/2018 0430   TCO2 18 (L) 06/28/2018 0430   ACIDBASEDEF 5.0 (H) 06/28/2018 0430   O2SAT 100.0 06/28/2018 0430     Coagulation Profile: Recent Labs  Lab 06/27/18 0024 06/27/18 0251 06/27/18 0824  INR 1.36 1.50 1.62    Cardiac Enzymes: Recent Labs  Lab 06/27/18 0024 06/27/18 0659 06/27/18 1203 06/27/18 1716  TROPONINI 0.07* 0.23* 0.23* 0.20*    HbA1C: No results found for: HGBA1C  CBG: Recent Labs  Lab 06/28/18 0007 06/28/18 0126 06/28/18 0128 06/28/18 0438 06/28/18 0646  GLUCAP 114* 179* 179* 160* 127*    Review of Systems:     Past Medical History  He,  has a past medical history of Diabetes mellitus without complication ([place]), Glaucoma, Hypertension, and Prostate cancer ([place]).   Surgical History    Past Surgical History:  Procedure Laterality Date  . PROSTATE BIOPSY    .  TONSILECTOMY/ADENOIDECTOMY WITH MYRINGOTOMY       Social History   reports that he has never smoked. He has never used smokeless tobacco. He reports that he does not drink alcohol or use drugs.   Family History   His family history includes Diabetes in his mother; Heart attack in his father.   Allergies Allergies  Allergen Reactions  . Amlodipine Other (See Comments)    Headaches  . Diltiazem Other (See Comments)    Headaches     Home Medications  Prior to Admission medications   Medication Sig Start Date End Date Taking? Authorizing Provider  Canagliflozin (INVOKANA PO) Take by mouth.    [provider]  Cholecalciferol (VITAMIN D PO) Take by mouth.    [provider]  hydrochlorothiazide (HYDRODIURIL) 25 MG tablet Take 25 mg by mouth daily.    [provider]  Iron-FA-B Cmp-C-Biot-Probiotic (FUSION PLUS) CAPS Take 1 capsule by mouth daily. 05/11/18   [provider]  metFORMIN (GLUCOPHAGE) 1000 MG tablet Take 1,000 mg by mouth 2 (two) times daily with a meal.    [provider]  ofloxacin (OCUFLOX) 0.3 % ophthalmic solution 1 drop 4 (four) times daily.    [provider]  Vitamin D, Ergocalciferol, (DRISDOL) 1.25 MG (50000 UT) CAPS capsule Take 50,000 Units by mouth once a week. 04/19/18   [provider]    Attending Note:  78 year old male s/p cardiac arrest with 23 minutes of PEA with a PNA and PE and pleural effusion and NSTEMI.  Overnight, rewarming.  EEG showed burst suppression.  On exam, remains paralyzed with clear lungs.  I reviewed CXR myself, ETT is in a good position.  Discussed with PCCM-resident.  Cardiology feels patient has demand ischemia and echo has been ordered.  Will need diureses once off pressors.  Anticipate levophed will be off once off propofol and rewarmed.  Adjust vent for ABG.  Replace electrolytes as indicated (K, Ca and Mg).  Rewarming now, to be fully rewarmed at 3:30 PM.  If no evidence  of seizures and levophed demand is still >10 then will place TLC.  Call neurology for consultation.  PCCM will continue to follow.  Will begin lasix if off pressors once rewarmed.  The patient is critically ill with multiple organ systems failure and requires high complexity decision making for assessment and support, frequent evaluation and titration of therapies, application of advanced monitoring technologies and extensive interpretation of multiple databases.   Critical Care Time devoted to patient care services described in this note is  36  Minutes. This time reflects time of care of this signee Dr Jennet Maduro. This critical care time does not reflect procedure time, or teaching time or supervisory time of PA/NP/Med student/Med Resident etc but could involve care discussion time.  Rush Farmer, M.D. [facility] Pulmonary/Critical Care Medicine. Pager: [phone]. After hours pager: [phone].

## 2018-06-28 NOTE — Progress Notes (Signed)
Christian Fry for Heparin Indication: pulmonary embolus in setting of cardiac arrest >> code cool  Allergies  Allergen Reactions  . Amlodipine Other (See Comments)    Headaches  . Diltiazem Other (See Comments)    Headaches    Patient Measurements: Height: 5\' 7"  (170.2 cm) Weight: 220 lb 7.4 oz (100 kg) IBW/kg (Calculated) : 66.1 Heparin Dosing Weight: 90kg  Vital Signs: Temp: 91.8 F (33.2 C) (01/29 0100) Temp Source: Bladder (01/29 0100) BP: 111/61 (01/29 0100) Pulse Rate: 69 (01/29 0100)  Labs: Recent Labs    06/23/2018 2214  06/27/18 0024  06/27/18 0251  06/27/18 0630 06/27/18 0659 06/27/18 0800 06/27/18 0820 06/27/18 0824 06/27/18 0946 06/27/18 1203 06/27/18 1716 06/27/18 2008 06/28/18 0130  HGB 8.3*   < >  --    < >  --    < > 8.5*  --   --  7.7*  --   --   --  9.3*  --   --   HCT 31.8*   < >  --    < >  --    < > 25.0*  --   --  27.2*  --   --   --  31.3*  --   --   PLT 152  --   --   --   --   --   --   --   --  154  --   --   --   --   --   --   APTT  --   --  36  --  35  --   --   --   --   --  >200*  --   --   --   --   --   LABPROT  --   --  16.7*  --  17.9*  --   --   --   --   --  19.0*  --   --   --   --   --   INR  --   --  1.36  --  1.50  --   --   --   --   --  1.62  --   --   --   --   --   HEPARINUNFRC  --   --   --   --   --   --   --   --  0.41  --   --   --   --  0.54  --  0.54  CREATININE 1.36*  --  1.30*  --   --   --   --  1.23  --   --   --  1.28*  --  1.38* 1.37*  --   TROPONINI  --    < > 0.07*  --   --   --   --  0.23*  --   --   --   --  0.23* 0.20*  --   --    < > = values in this interval not displayed.    Estimated Creatinine Clearance: 52.5 mL/min (A) (by C-G formula based on SCr of 1.37 mg/dL (H)).   Medical History: Past Medical History:  Diagnosis Date  . Diabetes mellitus without complication ([place])   . Glaucoma   . Hypertension   . Prostate cancer [facility])     Assessment: 78yo male  presents after cardiac arrest, CT reveals PE w/  small clot burden though there is evidence of RHS >> on hypothermia protocol.  1/29 AM update: Heparin level remains therapeutic at 0.54.   Rewarming to begin at 03:30 AM.   Goal of Therapy:  Heparin level 0.3-0.7 units/ml Monitor platelets by anticoagulation protocol: Yes   Plan:  Cont heparin at 900 units/hr Re-check heparin level at 0800-more frequent checks during re-warming period   Narda Bonds, PharmD, [place] Pharmacist Phone: [phone]

## 2018-06-28 NOTE — Progress Notes (Signed)
RT note: ETT pulled back 1cm per MD order.  Patient tolerated well.  Will continue to monitor.

## 2018-06-29 ENCOUNTER — Inpatient Hospital Stay (HOSPITAL_COMMUNITY): Payer: Medicare Other

## 2018-06-29 DIAGNOSIS — G931 Anoxic brain damage, not elsewhere classified: Secondary | ICD-10-CM

## 2018-06-29 LAB — CALCIUM, IONIZED: CALCIUM, IONIZED, SERUM: 4.2 mg/dL — AB (ref 4.5–5.6)

## 2018-06-29 LAB — POCT I-STAT 7, (LYTES, BLD GAS, ICA,H+H)
ACID-BASE DEFICIT: 9 mmol/L — AB (ref 0.0–2.0)
Bicarbonate: 18.5 mmol/L — ABNORMAL LOW (ref 20.0–28.0)
Calcium, Ion: 1.12 mmol/L — ABNORMAL LOW (ref 1.15–1.40)
HCT: 32 % — ABNORMAL LOW (ref 39.0–52.0)
Hemoglobin: 10.9 g/dL — ABNORMAL LOW (ref 13.0–17.0)
O2 SAT: 92 %
Potassium: 4.6 mmol/L (ref 3.5–5.1)
Sodium: 133 mmol/L — ABNORMAL LOW (ref 135–145)
TCO2: 20 mmol/L — ABNORMAL LOW (ref 22–32)
pCO2 arterial: 45 mmHg (ref 32.0–48.0)
pH, Arterial: 7.222 — ABNORMAL LOW (ref 7.350–7.450)
pO2, Arterial: 76 mmHg — ABNORMAL LOW (ref 83.0–108.0)

## 2018-06-29 LAB — GLUCOSE, CAPILLARY
GLUCOSE-CAPILLARY: 138 mg/dL — AB (ref 70–99)
Glucose-Capillary: 146 mg/dL — ABNORMAL HIGH (ref 70–99)
Glucose-Capillary: 167 mg/dL — ABNORMAL HIGH (ref 70–99)
Glucose-Capillary: 171 mg/dL — ABNORMAL HIGH (ref 70–99)
Glucose-Capillary: 184 mg/dL — ABNORMAL HIGH (ref 70–99)
Glucose-Capillary: 198 mg/dL — ABNORMAL HIGH (ref 70–99)

## 2018-06-29 LAB — CBC
HCT: 34.3 % — ABNORMAL LOW (ref 39.0–52.0)
Hemoglobin: 9.8 g/dL — ABNORMAL LOW (ref 13.0–17.0)
MCH: 23.3 pg — ABNORMAL LOW (ref 26.0–34.0)
MCHC: 28.6 g/dL — ABNORMAL LOW (ref 30.0–36.0)
MCV: 81.5 fL (ref 80.0–100.0)
NRBC: 0.9 % — AB (ref 0.0–0.2)
Platelets: 188 10*3/uL (ref 150–400)
RBC: 4.21 MIL/uL — ABNORMAL LOW (ref 4.22–5.81)
RDW: 19.4 % — ABNORMAL HIGH (ref 11.5–15.5)
WBC: 11.5 10*3/uL — AB (ref 4.0–10.5)

## 2018-06-29 LAB — MAGNESIUM
Magnesium: 2.1 mg/dL (ref 1.7–2.4)
Magnesium: 2 mg/dL (ref 1.7–2.4)

## 2018-06-29 LAB — CORTISOL: Cortisol, Plasma: 14.9 ug/dL

## 2018-06-29 LAB — CULTURE, RESPIRATORY W GRAM STAIN: Culture: NORMAL

## 2018-06-29 LAB — COMPREHENSIVE METABOLIC PANEL
ALT: 44 U/L (ref 0–44)
AST: 59 U/L — ABNORMAL HIGH (ref 15–41)
Albumin: 2.6 g/dL — ABNORMAL LOW (ref 3.5–5.0)
Alkaline Phosphatase: 96 U/L (ref 38–126)
Anion gap: 10 (ref 5–15)
BUN: 30 mg/dL — ABNORMAL HIGH (ref 8–23)
CO2: 19 mmol/L — ABNORMAL LOW (ref 22–32)
Calcium: 7.5 mg/dL — ABNORMAL LOW (ref 8.9–10.3)
Chloride: 105 mmol/L (ref 98–111)
Creatinine, Ser: 2.06 mg/dL — ABNORMAL HIGH (ref 0.61–1.24)
GFR calc Af Amer: 35 mL/min — ABNORMAL LOW (ref >60)
GFR, EST NON AFRICAN AMERICAN: 31 mL/min — AB (ref >60)
Glucose, Bld: 194 mg/dL — ABNORMAL HIGH (ref 70–99)
Potassium: 4.4 mmol/L (ref 3.5–5.1)
Sodium: 134 mmol/L — ABNORMAL LOW (ref 135–145)
Total Bilirubin: 1 mg/dL (ref 0.3–1.2)
Total Protein: 5.8 g/dL — ABNORMAL LOW (ref 6.5–8.1)

## 2018-06-29 LAB — HEPARIN LEVEL (UNFRACTIONATED)
Heparin Unfractionated: 0.35 IU/mL (ref 0.30–0.70)
Heparin Unfractionated: 0.38 IU/mL (ref 0.30–0.70)

## 2018-06-29 LAB — PHOSPHORUS
Phosphorus: 6.1 mg/dL — ABNORMAL HIGH (ref 2.5–4.6)
Phosphorus: 5.4 mg/dL — ABNORMAL HIGH (ref 2.5–4.6)

## 2018-06-29 MED ORDER — NOREPINEPHRINE 16 MG/250ML-% IV SOLN
0.0000 ug/min | INTRAVENOUS | Status: DC
  Administered 2018-06-29: 24 ug/min via INTRAVENOUS
  Administered 2018-06-29: 22 ug/min via INTRAVENOUS
  Administered 2018-06-30: 5 ug/min via INTRAVENOUS
  Filled 2018-06-29 (×6): qty 250

## 2018-06-29 MED ORDER — PANTOPRAZOLE SODIUM 40 MG PO PACK
40.0000 mg | PACK | Freq: Every day | ORAL | Status: DC
  Administered 2018-06-29: 40 mg
  Filled 2018-06-29: qty 20

## 2018-06-29 NOTE — Progress Notes (Signed)
RT note: respiratory rate increased from 15 to 20 by Dr. Nelda Marseille.

## 2018-06-29 NOTE — Progress Notes (Signed)
EEG completed; results pending.    

## 2018-06-29 NOTE — Progress Notes (Addendum)
NAME:  Christian Fry, MRN:  [id number], DOB:  12/06/1940, LOS: 2 ADMISSION DATE:  06/07/2018, CONSULTATION DATE:1/27 REFERRING MD:Dr. Dayna Barker, CHIEF COMPLAINT:Cardiac arrest  Brief History   78 year old man who was admit 1/27 after witnessed PEA/ asystole cardiac arrest with 23 minutes of resuscitation prior to [place]. He was found to have RLL PNA and effusions, PE with RV strain, elevated troponin, Hypercarbic respiratory and metabolic acidosis and volume overload.   Past Medical History  Diabetes Mellitus  Hypertension  Significant Hospital Events   1/27 admit after witnessed cardiac arrest with ROSC after 23 minutes, started on cooling protocol, EEG with burst suppression  1/29 7:30 PM achieved rewarming goal temperature   Consults:  Neurology   Procedures:  ETT 1/28 > Arterial line 1/28> Left IJ CVC 1/29 >   Significant Diagnostic Tests:  CTA chest 1/27 >Positive for acute PE with left upper lobe thrombus. While clot burden is small to moderate, there is CT evidence of right heart strain (RV/LV Ratio = 2.9).Near complete consolidation throughout the right lower lobe. Dense consolidation in the perihilar left lower lobe with surrounding ground-glass opacities.  CT head 1/27 >No acute intracranial abnormality.  EEG 1/28 > burst suppression seen throughout    Echo 1/28 > EF 55-60%, mildly reduced RV systolic function, mildly dilated right atrium, moderate tricuspid regurg  Micro Data:  Blood Culture 1/27 > ngtd  Sputum 1/27 > few GPC on gram stain, culture pending  RVP 1/28 >Neg  Strep pneumoniae 1/28 >neg  MRSA neg  Antimicrobials:  Ceftriaxone 1/28 > Azithromycin 1/28 > Zosyn Vancomycin 1/27 in ED  Interim history/subjective:  Achieved rewarming temperature at 7:30 PM yesterday evening, now fentanyl, versed and nimbex are off.  Labile blood pressures with attempts to wean levophed overnight  Echo performed yesterday EF 55-60%, mildly reduced RV systolic  function, mildly dilated right atrium, moderate tricuspid regurg  Objective   Blood pressure 117/68, pulse (!) 106, temperature (!) 97.5 F (36.4 C), temperature source Esophageal, resp. rate 15, height 5\' 9"  (1.753 m), weight 114.4 kg, SpO2 94 %.    Vent Mode: PRVC FiO2 (%):  [40 %-60 %] 50 % Set Rate:  [15 bmp-24 bmp] 15 bmp Vt Set:  [530 mL] 530 mL PEEP:  [5 cmH20-8 cmH20] 5 cmH20 Plateau Pressure:  [16 cmH20-24 cmH20] 16 cmH20   Intake/Output Summary (Last 24 hours) at 06/29/2018 3559 Last data filed at 06/29/2018 0600 Gross per 24 hour  Intake 4344.95 ml  Output 349 ml  Net 3995.95 ml   Filed Weights   06/01/2018 2309 06/29/18 0500  Weight: 100 kg 114.4 kg    Examination: General: ill appearing, not in acute distress  HENT: ETT Lungs: diffuse course breath sounds and wheezing  Cardiovascular: regular rate and rhythm, no murmur, 2+ bl lower extremity edema  Abdomen: cooling pads in place, soft  Extremities: 2+ edema, warm to touch  Neuro: some spontaneous movements of the mouth, does not respond to voice or painful stimulus, does not follow commands, pupils are small and unreactive, no gag  GU: foley in place, not much urine in the bag  Resolved Hospital Problem list   Lactic acidosis   Assessment & Plan:   Post PEA/ asystolic cardiac arrest 23 minutes  Refractory shock Right heart failure  NSTEMI  Acute anoxic encephalopathy  Patient is rewarmed, nimbex and sedation are off but has not had neurologic improvement. Echo yesterday was consistent with right heart failure  - repeat EEG today  -  MRI contrast when able  - neurology following, we appreciate their recommendations  - levophed to keep MAP > 65 - obtain cortisol  - amiodarone   - aspirin, holding off on beta blocker or ACE/ARB given shock and AKI    Acute hypoxic respiratory failure requiring intubation secondary to pulmonary embolism and post cardiac arrest  Respiratory Acidosis  Metabolic  acidosis Pulmonary embolism  Community acquired pneumonia  - full vent support  - PRN fentanyl  - increase rate, recheck ABG later today  - may start sodium bicarb  - heparin gtt  - ctx and azithromycin   AKI - oliguric  Minimal urine output yesterday with worsening creatinine and hyponatremia, net + 9 L this admission.  - obtain CVP  - May attempt a trial of lasix   Diabetes Mellitus  Blood glucose remain elevated.  - SSI and CBG monitoring  - start lantus 3 units qd   Normocytic anemia  Iron deficiency  Hemoglobin stable, transfuse for hemoglobin less than 8. Received 1 unit of PRBC 1/28.    Best practice:  Diet: Vital HP  Pain/Anxiety/Delirium protocol (if indicated): PRN fentanyl  VAP protocol (if indicated): Yes  DVT prophylaxis: heparin gtt  GI prophylaxis: protonix 40 mg qd  Glucose control: CBG, ISS  Mobility: Bed rest  Code Status: Full  Family Communication: nephew Jeneen Rinks updated at bedside yesterday Disposition: continue ICU monitorin    Labs   CBC: Recent Labs  Lab 06/17/2018 2214  06/27/18 0820  06/28/18 1804 06/28/18 2001 06/28/18 2232 06/29/18 0317 06/29/18 0329  WBC 12.2*  --  11.3*  --   --   --   --  11.5*  --   NEUTROABS 8.7*  --   --   --   --   --   --   --   --   HGB 8.3*   < > 7.7*   < > 11.2* 12.2* 12.6* 9.8* 10.9*  HCT 31.8*   < > 27.2*   < > 33.0* 36.0* 37.0* 34.3* 32.0*  MCV 84.1  --  76.6*  --   --   --   --  81.5  --   PLT 152  --  154  --   --   --   --  188  --    < > = values in this interval not displayed.    Basic Metabolic Panel: Recent Labs  Lab 06/27/18 0659 06/27/18 0946  06/27/18 1716 06/27/18 2008  06/28/18 0501  06/28/18 1129  06/28/18 1610 06/28/18 1615 06/28/18 1804 06/28/18 2001 06/28/18 2232 06/29/18 0329 06/29/18 0447  NA 133* 135  --  134* 134*   < > 134*   < >  --    < > 135  --  136 136 136 133* 134*  K 3.0* 3.0*   < > 3.2* 3.3*   < > 3.2*   < >  --    < > 4.1  --  4.2 4.3 4.3 4.6 4.4  CL 101 105   --  106 106  --  104  --   --   --   --   --   --   --   --   --  105  CO2 17* 17*  --  17* 16*  --  15*  --   --   --   --   --   --   --   --   --  19*  GLUCOSE 204* 187*  --  114* 116*  --  169*   < >  --    < > 137*  --  147* 145* 173*  --  194*  BUN 23 24*  --  23 23  --  24*  --   --   --   --   --   --   --   --   --  30*  CREATININE 1.23 1.28*  --  1.38* 1.37*  --  1.57*  --   --   --   --   --   --   --   --   --  2.06*  CALCIUM 7.8* 8.4*  --  8.1* 8.0*  --  7.8*  --   --   --   --   --   --   --   --   --  7.5*  MG 1.4*  --   --   --   --   --  1.8  --  2.1  --   --  2.1  --   --   --   --  2.1  PHOS 3.3  --   --   --   --   --  3.6  --  4.0  --   --  4.5  --   --   --   --  6.1*   < > = values in this interval not displayed.   GFR: Estimated Creatinine Clearance: 38.7 mL/min (A) (by C-G formula based on SCr of 2.06 mg/dL (H)). Recent Labs  Lab 06/21/2018 2214 06/27/18 0006 06/27/18 0228 06/27/18 0659 06/27/18 0820 06/29/18 0317  WBC 12.2*  --   --   --  11.3* 11.5*  LATICACIDVEN  --  6.1* 2.5* 2.3*  --   --     Liver Function Tests: Recent Labs  Lab 06/14/2018 2214 06/28/18 0501 06/29/18 0447  AST 64* 58* 59*  ALT 38 45* 44  ALKPHOS 100 83 96  BILITOT 1.3* 2.2* 1.0  PROT 6.7 5.4* 5.8*  ALBUMIN 3.5 2.8* 2.6*   Recent Labs  Lab 06/22/2018 2214  LIPASE 30   No results for input(s): AMMONIA in the last 168 hours.  ABG    Component Value Date/Time   PHART 7.222 (L) 06/29/2018 0329   PCO2ART 45.0 06/29/2018 0329   PO2ART 76.0 (L) 06/29/2018 0329   HCO3 18.5 (L) 06/29/2018 0329   TCO2 20 (L) 06/29/2018 0329   ACIDBASEDEF 9.0 (H) 06/29/2018 0329   O2SAT 92.0 06/29/2018 0329     Coagulation Profile: Recent Labs  Lab 06/27/18 0024 06/27/18 0251 06/27/18 0824  INR 1.36 1.50 1.62    Cardiac Enzymes: Recent Labs  Lab 06/27/18 0024 06/27/18 0659 06/27/18 1203 06/27/18 1716  TROPONINI 0.07* 0.23* 0.23* 0.20*    HbA1C: No results found for:  HGBA1C  CBG: Recent Labs  Lab 06/28/18 0438 06/28/18 0646 06/28/18 1133 06/29/18 0008 06/29/18 0334  GLUCAP 160* 127* 104* 167* 184*    Review of Systems:     Past Medical History  He,  has a past medical history of Diabetes mellitus without complication ([place]), Glaucoma, Hypertension, and Prostate cancer ([place]).   Surgical History    Past Surgical History:  Procedure Laterality Date  . PROSTATE BIOPSY    . TONSILECTOMY/ADENOIDECTOMY WITH MYRINGOTOMY       Social History   reports that he has never smoked. He has never used smokeless tobacco. He reports  that he does not drink alcohol or use drugs.   Family History   His family history includes Diabetes in his mother; Heart attack in his father.   Allergies Allergies  Allergen Reactions  . Amlodipine Other (See Comments)    Headaches  . Diltiazem Other (See Comments)    Headaches     Home Medications  Prior to Admission medications   Medication Sig Start Date End Date Taking? Authorizing Provider  Canagliflozin (INVOKANA PO) Take by mouth.    [provider]  Cholecalciferol (VITAMIN D PO) Take by mouth.    [provider]  hydrochlorothiazide (HYDRODIURIL) 25 MG tablet Take 25 mg by mouth daily.    [provider]  Iron-FA-B Cmp-C-Biot-Probiotic (FUSION PLUS) CAPS Take 1 capsule by mouth daily. 05/11/18   [provider]  metFORMIN (GLUCOPHAGE) 1000 MG tablet Take 1,000 mg by mouth 2 (two) times daily with a meal.    [provider]  ofloxacin (OCUFLOX) 0.3 % ophthalmic solution 1 drop 4 (four) times daily.    [provider]  Vitamin D, Ergocalciferol, (DRISDOL) 1.25 MG (50000 UT) CAPS capsule Take 50,000 Units by mouth once a week. 04/19/18   [provider]     Critical care time:     Attending Note:  78 year old male with PEA and asystole for 24 minutes with PEA, PNA and respiratory failure.  Patient was rewarmed and with no events overnight.   On exam, he is unresponsive with clear lungs.  I reviewed CXR myself, ETT is in a good position.  Discussed with resident.  Will increase RR to 20.  Spoke with family.  Full DNR at this point.  Plan EEG and MRI and will plan family meeting in AM.  The patient is critically ill with multiple organ systems failure and requires high complexity decision making for assessment and support, frequent evaluation and titration of therapies, application of advanced monitoring technologies and extensive interpretation of multiple databases.   Critical Care Time devoted to patient care services described in this note is  32  Minutes. This time reflects time of care of this signee Dr Jennet Maduro. This critical care time does not reflect procedure time, or teaching time or supervisory time of PA/NP/Med student/Med Resident etc but could involve care discussion time.  Rush Farmer, M.D. [facility] Pulmonary/Critical Care Medicine. Pager: [phone]. After hours pager: [phone].

## 2018-06-29 NOTE — Progress Notes (Signed)
200 mL fentanyl wasted in 2H med room waste container w/ [employer] trader.   Nelva Bush RN

## 2018-06-29 NOTE — Progress Notes (Signed)
[place] for Heparin Indication: pulmonary embolus in setting of cardiac arrest, s/p code cool  Allergies  Allergen Reactions  . Amlodipine Other (See Comments)    Headaches  . Diltiazem Other (See Comments)    Headaches    Patient Measurements: Height: 5\' 9"  (175.3 cm) Weight: 252 lb 3.3 oz (114.4 kg) IBW/kg (Calculated) : 70.7 Heparin Dosing Weight: 90kg  Vital Signs: Temp: 97.5 F (36.4 C) (01/30 0600) Temp Source: Esophageal (01/30 0600) BP: 121/78 (01/30 0730) Pulse Rate: 107 (01/30 0730)  Labs: Recent Labs    06/18/2018 2214  06/27/18 0024  06/27/18 0251  06/27/18 0659  06/27/18 0820 06/27/18 0824  06/27/18 1203 06/27/18 1716 06/27/18 2008  06/28/18 0501 06/28/18 0800  06/28/18 2225 06/28/18 2232 06/29/18 0317 06/29/18 0329 06/29/18 0447 06/29/18 0625  HGB 8.3*   < >  --    < >  --    < >  --   --  7.7*  --   --   --  9.3*  --    < >  --   --    < >  --  12.6* 9.8* 10.9*  --   --   HCT 31.8*   < >  --    < >  --    < >  --   --  27.2*  --   --   --  31.3*  --    < >  --   --    < >  --  37.0* 34.3* 32.0*  --   --   PLT 152  --   --   --   --   --   --   --  154  --   --   --   --   --   --   --   --   --   --   --  188  --   --   --   APTT  --   --  36  --  35  --   --   --   --  >200*  --   --   --   --   --   --   --   --   --   --   --   --   --   --   LABPROT  --   --  16.7*  --  17.9*  --   --   --   --  19.0*  --   --   --   --   --   --   --   --   --   --   --   --   --   --   INR  --   --  1.36  --  1.50  --   --   --   --  1.62  --   --   --   --   --   --   --   --   --   --   --   --   --   --   HEPARINUNFRC  --   --   --   --   --   --   --    < >  --   --   --   --  0.54  --    < >  --  0.43  --  <  0.10*  --   --   --   --  0.35  CREATININE 1.36*  --  1.30*  --   --   --  1.23  --   --   --    < >  --  1.38* 1.37*  --  1.57*  --   --   --   --   --   --  2.06*  --   TROPONINI  --    < > 0.07*  --   --   --   0.23*  --   --   --   --  0.23* 0.20*  --   --   --   --   --   --   --   --   --   --   --    < > = values in this interval not displayed.    Estimated Creatinine Clearance: 38.7 mL/min (A) (by C-G formula based on SCr of 2.06 mg/dL (H)).   Medical History: Past Medical History:  Diagnosis Date  . Diabetes mellitus without complication ([place])   . Glaucoma   . Hypertension   . Prostate cancer [facility])     Assessment: 78yo male presents after cardiac arrest, CT reveals PE w/ small clot burden though there is evidence of RHS >> s/p hypothermia protocol, now rewarmed.  Heparin level this AM now within goal range.  No overt bleeding or complications noted.  Goal of Therapy:  Heparin level 0.3-0.7 units/ml Monitor platelets by anticoagulation protocol: Yes   Plan:  Continue IV heparin at current rate. Recheck heparin level in 8 hrs Daily heparin level and CBC. F/u plans for oral anticoagulation when/if able.  Marguerite Olea, [facility] Clinical Pharmacist Phone [phone]  06/29/2018 7:50 AM

## 2018-06-29 NOTE — Progress Notes (Signed)
NEUROLOGY PROGRESS NOTE  Subjective: Patient rewarmed on 06/28/2018 at 7:30 PM. Still intubated not breathing over the vent.  Does not respond to verbal or noxious stimuli.  Fentanyl DC'd at 0100 hrs. on 06/29/2018 Propofol was DC'd at 1900 hrs. on 06/28/2018 Nimbex was DC'd at 1529 on 06/28/2018   Exam: Vitals:   06/29/18 0945 06/29/18 1000  BP: 105/61 124/68  Pulse: (!) 105 (!) 108  Resp: 15 15  Temp:  98.6 F (37 C)  SpO2: 91% 93%    Physical Exam   HEENT-  Normocephalic, no lesions, without obvious abnormality.  Normal external eye and conjunctiva.  Extremities- Warm, dry and intact Musculoskeletal-no joint tenderness, deformity or swelling Skin-warm and dry, no hyperpigmentation, vitiligo, or suspicious lesions    Neuro:  Mental Status: Patient does not respond to verbal stimuli.  Does not respond to deep sternal rub.  Does not follow commands.  No verbalizations noted.  Is not breathing over the vent Cranial Nerves: II: patient does not respond confrontation bilaterally,  III,IV,VI: doll's response minimally present bilaterally. pupils right 1 mm, left 1 mm,and nonreactive bilaterally bilaterally V,VII: corneal reflex present on the right not present on the left VIII: patient does not respond to verbal stimuli IX,X: gag reflex present, XI: trapezius strength unable to test bilaterally XII: tongue strength unable to test Motor: Extremities flaccid throughout.  No spontaneous movement noted.  No purposeful movements noted. Sensory: Does not respond to noxious stimuli in any extremity. Deep Tendon Reflexes:  Absent throughout. Plantars: absent bilaterally Cerebellar: Unable to perform  Medications:  Scheduled: . sodium chloride   Intravenous Once  . artificial tears  1 application Both Eyes V8L  . aspirin  81 mg Per Tube Daily  . chlorhexidine gluconate (MEDLINE KIT)  15 mL Mouth Rinse BID  . cisatracurium  0.1 mg/kg Intravenous Once  . feeding supplement  (PRO-STAT SUGAR FREE 64)  60 mL Per Tube TID  . feeding supplement (VITAL HIGH PROTEIN)  1,000 mL Per Tube Q24H  . fentaNYL (SUBLIMAZE) injection  50 mcg Intravenous Once  . insulin aspart  3-9 Units Subcutaneous Q4H  . mouth rinse  15 mL Mouth Rinse 10 times per day  . pantoprazole sodium  40 mg Per Tube Daily   Continuous: . sodium chloride 500 mL (06/28/18 0928)  . amiodarone 30 mg/hr (06/29/18 1000)  . azithromycin Stopped (06/29/18 0147)  . cefTRIAXone (ROCEPHIN)  IV Stopped (06/29/18 0111)  . cisatracurium (NIMBEX) infusion Stopped (06/28/18 1534)  . heparin 1,150 Units/hr (06/29/18 1000)  . norepinephrine (LEVOPHED) Adult infusion    . propofol (DIPRIVAN) infusion Stopped (06/28/18 1953)   FYB:OFBPZW chloride, fentaNYL  Pertinent Labs/Diagnostics:      Vas [place] Lower Extremity Venous (dvt)  Result Date: 06/27/2018  Lower Venous Study Indications: Pulmonary embolism, and Rule out DVT.  Summary: Right: There is no evidence of deep vein thrombosis in the lower extremity. However, portions of this examination were limited- see technologist comments above. Left: Findings consistent with acute deep vein thrombosis involving the left posterior tibial vein.  *See table(s) above for measurements and observations. Electronically signed by Deitra Mayo MD on 06/27/2018 at 5:08:49 PM.    Final      Etta Quill PA-C [facility] [phone]   Assessment: 78 year old male with PEA arrest with ROSC after approximately 23 minutes however could be longer.  In addition found to have left upper lobe PE.  Thus far all sedation and paralyzing medications has been weaned off and DC'd. Patient  has now been rewarmed for 15 hours.  As noted prior initial EEG showed burst suppression.   Patient does show a gag reflex, corneal reflex on the right, and minimal doll's which is improvement from yesterday.  However still shows no cortical function.  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 Agree with the history and physical as documented above. Agree with the plan as documented, which I helped formulate. I have independently reviewed the chart, obtained history, review of systems and examined the patient.I have personally reviewed pertinent head/neck/spine imaging (CT/MRI). Please feel free to call with any questions. --- Amie Portland, MD [facility] Pager: [phone]  If 7pm to 7am, please call on call as listed on AMION.

## 2018-06-29 NOTE — Procedures (Signed)
ELECTROENCEPHALOGRAM REPORT   Patient: Christian Fry       Room #: [facility] EEG No. ID: [id number] Age: 78 y.o.        Sex: male Referring Physician: Nelda Marseille Report Date:  06/29/2018        Interpreting Physician: Alexis Goodell  History: Christian Fry is an 78 y.o. male s/p arrest  Medications:  ASA, Zithromax, Rocephin, Protonix, Insulin, Heparin, Levophed  Conditions of Recording:  This is a 21 channel routine scalp EEG performed with bipolar and monopolar montages arranged in accordance to the international 10/20 system of electrode placement. One channel was dedicated to EKG recording.  The patient is in the intubated and unresponsive state.  Description:  The background activity is discontinuous.  There are periods of attenuation lasting from 2-5 seconds.  These periods of attenuation alternate with synchronous bursts of irregular activity that last from 1-3 seconds.  This pattern is persistent throughout the recording.  There is no change in the background rhythm with stimulation or active eye opening. Hyperventilation and intermittent photic stimulation were not performed.  IMPRESSION: This is an abnormal EEG due to a burst-suppression pattern seen throughout the tracing. Burst suppression pattern can be seen in a variety of circumstances, but in this circumstance is likely secondary to medications.  Clinical/neurological, and radiographic correlation advised.    Alexis Goodell, MD Neurology [phone] 06/29/2018, 2:29 PM

## 2018-06-29 NOTE — Progress Notes (Signed)
[place] for Heparin Indication: pulmonary embolus in setting of cardiac arrest, s/p code cool  Allergies  Allergen Reactions  . Amlodipine Other (See Comments)    Headaches  . Diltiazem Other (See Comments)    Headaches    Patient Measurements: Height: 5\' 9"  (175.3 cm) Weight: 252 lb 3.3 oz (114.4 kg) IBW/kg (Calculated) : 70.7 Heparin Dosing Weight: 90kg  Vital Signs: Temp: 98.6 F (37 C) (01/30 1200) Temp Source: Bladder (01/30 1200) BP: 133/82 (01/30 1345) Pulse Rate: 102 (01/30 1345)  Labs: Recent Labs    06/10/2018 2214  06/27/18 0024  06/27/18 0251  06/27/18 0659  06/27/18 0820 06/27/18 0824  06/27/18 1203 06/27/18 1716 06/27/18 2008  06/28/18 0501  06/28/18 2225 06/28/18 2232 06/29/18 0317 06/29/18 0329 06/29/18 0447 06/29/18 0625 06/29/18 1343  HGB 8.3*   < >  --    < >  --    < >  --   --  7.7*  --   --   --  9.3*  --    < >  --    < >  --  12.6* 9.8* 10.9*  --   --   --   HCT 31.8*   < >  --    < >  --    < >  --   --  27.2*  --   --   --  31.3*  --    < >  --    < >  --  37.0* 34.3* 32.0*  --   --   --   PLT 152  --   --   --   --   --   --   --  154  --   --   --   --   --   --   --   --   --   --  188  --   --   --   --   APTT  --   --  36  --  35  --   --   --   --  >200*  --   --   --   --   --   --   --   --   --   --   --   --   --   --   LABPROT  --   --  16.7*  --  17.9*  --   --   --   --  19.0*  --   --   --   --   --   --   --   --   --   --   --   --   --   --   INR  --   --  1.36  --  1.50  --   --   --   --  1.62  --   --   --   --   --   --   --   --   --   --   --   --   --   --   HEPARINUNFRC  --   --   --   --   --   --   --    < >  --   --   --   --  0.54  --    < >  --    < > <  0.10*  --   --   --   --  0.35 0.38  CREATININE 1.36*  --  1.30*  --   --   --  1.23  --   --   --    < >  --  1.38* 1.37*  --  1.57*  --   --   --   --   --  2.06*  --   --   TROPONINI  --    < > 0.07*  --   --   --   0.23*  --   --   --   --  0.23* 0.20*  --   --   --   --   --   --   --   --   --   --   --    < > = values in this interval not displayed.    Estimated Creatinine Clearance: 38.7 mL/min (A) (by C-G formula based on SCr of 2.06 mg/dL (H)).   Medical History: Past Medical History:  Diagnosis Date  . Diabetes mellitus without complication ([place])   . Glaucoma   . Hypertension   . Prostate cancer [facility])     Assessment: 78yo male presents after cardiac arrest, CT reveals PE w/ small clot burden though there is evidence of RHS >> s/p hypothermia protocol, now rewarmed.  Heparin level remains within goal range. No issues with heparin infusion or signs/symptoms of bleeding noted.   Goal of Therapy:  Heparin level 0.3-0.7 units/ml Monitor platelets by anticoagulation protocol: Yes   Plan:  Continue IV heparin at current rate of 1150 units/hr Daily heparin level and CBC F/u plans for oral anticoagulation when/if able  Thank you for allowing pharmacy to be a part of this patient's care.  Leron Croak, PharmD PGY1 Pharmacy Resident Phone: [phone]  Please check AMION for all [place] phone numbers  06/29/2018 2:40 PM

## 2018-06-29 NOTE — Progress Notes (Signed)
RT called to the bedside to transport pt on vent to MRI. Equipment checked -placed full tanks on vent. Per RN at the bedside was notified by RT to contact me ready to transport. RN called RT to bedside again with transport ready to transport pt to MRI. RT was prepared and ready to transport pt on vent - pt started to not be in sync with the vent - decreased Ve and decreased Vt. RN and transport at the bedside. Per RN restarted sedation. BBS clear dim. RT sxn ETT scant yellow and oral mod clear thick. Pt continued to not be in sync with the vent. Per RN to HOLD on transport at this time. RT attempts three times to transport - at this time due to pt not being in sync with the vent the transport was put on HOLD. Pt remains on same setting. Per RN started more sedation. RT will continue to follow. Rn and transport aware

## 2018-06-29 NOTE — Progress Notes (Signed)
Lower partials removed with assistance of RT. Placed in pt's dentures box in room with previously removed upper partials.

## 2018-06-29 NOTE — Progress Notes (Signed)
During bath, upon remove of defib pads, a small skin tear was noted.  Placed foam over top of skin tear and replaced arctic sun pads.

## 2018-06-30 ENCOUNTER — Inpatient Hospital Stay (HOSPITAL_COMMUNITY): Payer: Medicare Other

## 2018-06-30 DIAGNOSIS — Z7189 Other specified counseling: Secondary | ICD-10-CM

## 2018-06-30 LAB — CBC
HCT: 31.7 % — ABNORMAL LOW (ref 39.0–52.0)
Hemoglobin: 9.1 g/dL — ABNORMAL LOW (ref 13.0–17.0)
MCH: 23 pg — ABNORMAL LOW (ref 26.0–34.0)
MCHC: 28.7 g/dL — ABNORMAL LOW (ref 30.0–36.0)
MCV: 80.3 fL (ref 80.0–100.0)
Platelets: 146 10*3/uL — ABNORMAL LOW (ref 150–400)
RBC: 3.95 MIL/uL — AB (ref 4.22–5.81)
RDW: 19.4 % — ABNORMAL HIGH (ref 11.5–15.5)
WBC: 9.8 10*3/uL (ref 4.0–10.5)
nRBC: 0.6 % — ABNORMAL HIGH (ref 0.0–0.2)

## 2018-06-30 LAB — PHOSPHORUS: Phosphorus: 5.3 mg/dL — ABNORMAL HIGH (ref 2.5–4.6)

## 2018-06-30 LAB — COMPREHENSIVE METABOLIC PANEL
ALT: 33 U/L (ref 0–44)
AST: 51 U/L — ABNORMAL HIGH (ref 15–41)
Albumin: 2.4 g/dL — ABNORMAL LOW (ref 3.5–5.0)
Alkaline Phosphatase: 106 U/L (ref 38–126)
Anion gap: 12 (ref 5–15)
BUN: 43 mg/dL — ABNORMAL HIGH (ref 8–23)
CO2: 17 mmol/L — AB (ref 22–32)
Calcium: 7.7 mg/dL — ABNORMAL LOW (ref 8.9–10.3)
Chloride: 108 mmol/L (ref 98–111)
Creatinine, Ser: 2.16 mg/dL — ABNORMAL HIGH (ref 0.61–1.24)
GFR calc Af Amer: 33 mL/min — ABNORMAL LOW (ref >60)
GFR calc non Af Amer: 29 mL/min — ABNORMAL LOW (ref >60)
Glucose, Bld: 207 mg/dL — ABNORMAL HIGH (ref 70–99)
Potassium: 3.5 mmol/L (ref 3.5–5.1)
SODIUM: 137 mmol/L (ref 135–145)
Total Bilirubin: 0.8 mg/dL (ref 0.3–1.2)
Total Protein: 5.4 g/dL — ABNORMAL LOW (ref 6.5–8.1)

## 2018-06-30 LAB — GLUCOSE, CAPILLARY
GLUCOSE-CAPILLARY: 162 mg/dL — AB (ref 70–99)
Glucose-Capillary: 120 mg/dL — ABNORMAL HIGH (ref 70–99)
Glucose-Capillary: 154 mg/dL — ABNORMAL HIGH (ref 70–99)
Glucose-Capillary: 193 mg/dL — ABNORMAL HIGH (ref 70–99)

## 2018-06-30 LAB — CALCIUM, IONIZED: Calcium, Ionized, Serum: 4.1 mg/dL — ABNORMAL LOW (ref 4.5–5.6)

## 2018-06-30 LAB — MAGNESIUM: Magnesium: 1.9 mg/dL (ref 1.7–2.4)

## 2018-06-30 LAB — HEPARIN LEVEL (UNFRACTIONATED): Heparin Unfractionated: 0.4 IU/mL (ref 0.30–0.70)

## 2018-06-30 MED ORDER — SODIUM CHLORIDE 0.9% FLUSH: 10.0000 mL | INTRAVENOUS | Status: DC | PRN

## 2018-06-30 MED ORDER — PROPOFOL 1000 MG/100ML IV EMUL
0.0000 ug/kg/min | INTRAVENOUS | Status: DC
  Filled 2018-06-30: qty 100

## 2018-06-30 MED ORDER — SODIUM CHLORIDE 0.9% FLUSH
10.0000 mL | Freq: Two times a day (BID) | INTRAVENOUS | Status: DC
  Administered 2018-06-30 – 2018-07-01 (×2): 10 mL

## 2018-06-30 MED ORDER — CHLORHEXIDINE GLUCONATE CLOTH 2 % EX PADS
6.0000 | MEDICATED_PAD | Freq: Every day | CUTANEOUS | Status: DC
  Administered 2018-06-30 – 2018-07-01 (×2): 6 via TOPICAL

## 2018-06-30 MED ORDER — INSULIN GLARGINE 100 UNIT/ML ~~LOC~~ SOLN
3.0000 [IU] | Freq: Every day | SUBCUTANEOUS | Status: DC
  Filled 2018-06-30: qty 0.03

## 2018-06-30 MED ORDER — POTASSIUM CHLORIDE 20 MEQ/15ML (10%) PO SOLN
20.0000 meq | Freq: Once | ORAL | Status: AC
  Administered 2018-06-30: 20 meq
  Filled 2018-06-30: qty 15

## 2018-06-30 NOTE — Progress Notes (Signed)
Neurology Progress Note  S:// Patient seen and examined.  No acute overnight events MRI completed.  Evidence of anoxic injury globally.  O:// Current vital signs: BP 102/78   Pulse 77   Temp 99 F (37.2 C) (Other (Comment))   Resp 20   Ht '5\' 9"'  (1.753 m)   Wt 117.9 kg   SpO2 100%   BMI 38.38 kg/m  Vital signs in last 24 hours: Temp:  [97.9 F (36.6 C)-99 F (37.2 C)] 99 F (37.2 C) (01/31 0823) Pulse Rate:  [75-110] 77 (01/31 0815) Resp:  [15-20] 20 (01/31 0815) BP: (98-137)/(55-82) 102/78 (01/31 0800) SpO2:  [91 %-100 %] 100 % (01/31 0815) Arterial Line BP: (101-264)/(44-253) 113/58 (01/31 0815) FiO2 (%):  [40 %] 40 % (01/31 0759) Weight:  [117.9 kg] 117.9 kg (01/31 0400) Essentially unchanged exam with partial brainstem reflexes present. No evidence of higher cortical function. Neuro: Mental Status: Patient does not respond to verbal stimuli.  Does not respond to deep sternal rub.  Does not follow commands.  No verbalizations noted.  Is not breathing over the vent Cranial Nerves: II: patient does not respond confrontation bilaterally,  III,IV,VI: doll's response minimally present bilaterally. pupils right 1 mm, left 1 mm,and nonreactive bilaterally bilaterally V,VII: corneal reflex present on the right not present on the left VIII: patient does not respond to verbal stimuli IX,X: gag reflex present, XI: trapezius strength unable to test bilaterally XII: tongue strength unable to test Motor: Extremities flaccid throughout.  No spontaneous movement noted.  No purposeful movements noted. Sensory: Does not respond to noxious stimuli in any extremity. Deep Tendon Reflexes:  Absent throughout. Plantars: absent bilaterally Cerebellar: Unable to perform  Medications  Current Facility-Administered Medications:  .  0.9 %  sodium chloride infusion (Manually program via Guardrails IV Fluids), , Intravenous, Once, Ledell Noss, MD, Stopped at 06/27/18 1151 .  0.9 %   sodium chloride infusion, , Intravenous, PRN, Anders Simmonds, MD, Last Rate: 10 mL/hr at 06/28/18 0928, 500 mL at 06/28/18 0928 .  amiodarone (NEXTERONE PREMIX) 360-4.14 MG/200ML-% (1.8 mg/mL) IV infusion, 30 mg/hr, Intravenous, Continuous, Rush Farmer, MD, Last Rate: 16.67 mL/hr at 06/30/18 0925, 30 mg/hr at 06/30/18 0925 .  artificial tears (LACRILUBE) ophthalmic ointment 1 application, 1 application, Both Eyes, Q8H, Sood, Vineet, MD, 1 application at 76/73/41 0522 .  aspirin chewable tablet 81 mg, 81 mg, Per Tube, Daily, Anders Simmonds, MD, 81 mg at 06/29/18 1004 .  azithromycin (ZITHROMAX) 500 mg in sodium chloride 0.9 % 250 mL IVPB, 500 mg, Intravenous, Q24H, Chesley Mires, MD, Stopped at 06/30/18 0201 .  cefTRIAXone (ROCEPHIN) 1 g in sodium chloride 0.9 % 100 mL IVPB, 1 g, Intravenous, Q24H, Chesley Mires, MD, Stopped at 06/30/18 0041 .  chlorhexidine gluconate (MEDLINE KIT) (PERIDEX) 0.12 % solution 15 mL, 15 mL, Mouth Rinse, BID, Rush Farmer, MD, 15 mL at 06/30/18 0800 .  Chlorhexidine Gluconate Cloth 2 % PADS 6 each, 6 each, Topical, Daily, Yacoub, Wesam G, MD .  feeding supplement (PRO-STAT SUGAR FREE 64) liquid 60 mL, 60 mL, Per Tube, TID, Rush Farmer, MD, 60 mL at 06/29/18 2209 .  feeding supplement (VITAL HIGH PROTEIN) liquid 1,000 mL, 1,000 mL, Per Tube, Q24H, Rush Farmer, MD, 1,000 mL at 06/29/18 1200 .  heparin ADULT infusion 100 units/mL (25000 units/239m sodium chloride 0.45%), 1,150 Units/hr, Intravenous, Continuous, LErenest Blank RPH, Last Rate: 11.5 mL/hr at 06/30/18 0700, 1,150 Units/hr at 06/30/18 0700 .  insulin aspart (  novoLOG) injection 3-9 Units, 3-9 Units, Subcutaneous, Q4H, Chesley Mires, MD, 6 Units at 06/30/18 [phone] .  insulin glargine (LANTUS) injection 3 Units, 3 Units, Subcutaneous, Daily, Ledell Noss, MD .  MEDLINE mouth rinse, 15 mL, Mouth Rinse, 10 times per day, Rush Farmer, MD, 15 mL at 06/30/18 0600 .  norepinephrine (LEVOPHED) 16 mg in  D5W 258m premix infusion, 0-50 mcg/min, Intravenous, Titrated, YRush Farmer MD, Last Rate: 15.94 mL/hr at 06/30/18 0700, 17 mcg/min at 06/30/18 0700 .  pantoprazole sodium (PROTONIX) 40 mg/20 mL oral suspension 40 mg, 40 mg, Per Tube, Daily, BLedell Noss MD, 40 mg at 06/29/18 1004 .  propofol (DIPRIVAN) 1000 MG/100ML infusion, 0-50 mcg/kg/min, Intravenous, Continuous, BLedell Noss MD .  sodium chloride flush (NS) 0.9 % injection 10-40 mL, 10-40 mL, Intracatheter, Q12H, YNelda Marseille Wesam G, MD .  sodium chloride flush (NS) 0.9 % injection 10-40 mL, 10-40 mL, Intracatheter, PRN, YRush Farmer MD Labs CBC    Component Value Date/Time   WBC 9.8 06/30/2018 0358   RBC 3.95 (L) 06/30/2018 0358   HGB 9.1 (L) 06/30/2018 0358   HCT 31.7 (L) 06/30/2018 0358   PLT 146 (L) 06/30/2018 0358   MCV 80.3 06/30/2018 0358   MCH 23.0 (L) 06/30/2018 0358   MCHC 28.7 (L) 06/30/2018 0358   RDW 19.4 (H) 06/30/2018 0358   LYMPHSABS 3.2 06/10/2018 2214   MONOABS 0.2 06/13/2018 2214   EOSABS 0.0 06/25/2018 2214   BASOSABS 0.0 06/16/2018 2214    CMP     Component Value Date/Time   NA 137 06/30/2018 0358   K 3.5 06/30/2018 0358   CL 108 06/30/2018 0358   CO2 17 (L) 06/30/2018 0358   GLUCOSE 207 (H) 06/30/2018 0358   BUN 43 (H) 06/30/2018 0358   CREATININE 2.16 (H) 06/30/2018 0358   CALCIUM 7.7 (L) 06/30/2018 0358   PROT 5.4 (L) 06/30/2018 0358   ALBUMIN 2.4 (L) 06/30/2018 0358   AST 51 (H) 06/30/2018 0358   ALT 33 06/30/2018 0358   ALKPHOS 106 06/30/2018 0358   BILITOT 0.8 06/30/2018 0358   GFRNONAA 29 (L) 06/30/2018 0358   GFRAA 33 (L) 06/30/2018 0358    glycosylated hemoglobin  Lipid Panel     Component Value Date/Time   TRIG 88 06/28/2018 0501     Imaging I have reviewed images in epic and the results pertinent to this consultation are: MRI brain with evidence of global hypoxic anoxic injury.  Assessment: 78year old man with about 20 to 25 minutes worth of PEA cardiac arrest and  upper lobe pulmonary embolism who has been off of sedation and paralyzing medications for at least hours showing no evidence of high brainstem function.  Minimal brainstem reflexes present. MRI suggestive of global hypoxic anoxic injury. Given all these factors, the likelihood of neurologically meaningful recovery is bleak. I would recommend doing comfort measures and not pursuing aggressive care based on this. I think the family is on board and I spoke with them yesterday and explained to them that if MRI imaging also supports the clinical picture, there might not be chance of any good prognosis and the family was understanding.  Impression: Severe anoxic brain injury.  Recommendations: Spoke with PCCM attending.  He has working with the family and they are pursuing comfort measures.  Neurology service will sign off.  Please call with questions.   -- AAmie Portland MD [facility] Pager: [phone] 7pm to 7am, please call on call as listed on AMION.

## 2018-06-30 NOTE — Progress Notes (Signed)
    Chart reviewed.  Patient now converted to sinus rhythm on amiodarone. Remains on IV heparin drip because of PE and A. Fib.  Continues to have minimal neurologic improvement.  Continue to monitor from cardiology standpoint but no active recommendations or changes.   We will sign off for now.  [facility] will sign off.   Medication Recommendations: Continue amiodarone, but could consider converting over to oral/per tube for 400 mg twice daily either later on today or tomorrow to avoid excess fluid Other recommendations (labs, testing, etc): Continue anticoagulation as recommended with PE. Follow up as an outpatient: If he were to recover, we will arrange follow-up for A. Fib.  Glenetta Hew, MD

## 2018-06-30 NOTE — Progress Notes (Signed)
MRI results discussed with neurology MD.  Anoxic injury noted.  EEG and MRI results discussed with the family.  After discussion, patient would not want to live this way and opted for comfort.  Family would like to bring additional family members in AM and will proceed with comfort.  Will d/c all blood draws and medications that are not comfort related.  No further escalation of care.  Rush Farmer, M.D. [facility] Pulmonary/Critical Care Medicine. Pager: [phone]. After hours pager: [phone].

## 2018-06-30 NOTE — Progress Notes (Signed)
Nutrition Brief Note  Chart reviewed. Pt now transitioning to comfort care. No further nutrition interventions warranted at this time.  Please re-consult as needed.    Luca Burston MS, RD, LDN, CNSC [phone] Pager  [phone] Weekend/On-Call Pager     

## 2018-06-30 NOTE — Progress Notes (Addendum)
NAME:  Christian Fry, MRN:  [id number], DOB:  [birth year], LOS: 2 ADMISSION DATE:  06/24/2018, CONSULTATION DATE:1/27 REFERRING MD:Dr. Dayna Barker, CHIEF COMPLAINT:Cardiac arrest  Brief History   78 year old man who was admit 1/27 after witnessed PEA/ asystole cardiac arrest with 23 minutes of resuscitation prior to [place]. He was found to have RLL PNA and effusions, PE with RV strain, elevated troponin, Hypercarbic respiratory and metabolic acidosis and volume overload.   Past Medical History  Diabetes Mellitus  Hypertension  Significant Hospital Events   1/27 admit after witnessed cardiac arrest with ROSC after 23 minutes, started on cooling protocol, EEG with burst suppression  1/29 7:30 PM achieved rewarming goal temperature  1/30 follow up EEG with burst suppression   Consults:  Neurology   Procedures:  ETT 1/28 > Arterial line 1/28> Left IJ CVC 1/29 >   Significant Diagnostic Tests:  CTA chest 1/27 >Positive for acute PE with left upper lobe thrombus. While clot burden is small to moderate, there is CT evidence of right heart strain (RV/LV Ratio = 2.9).Near complete consolidation throughout the right lower lobe. Dense consolidation in the perihilar left lower lobe with surrounding ground-glass opacities.  CT head 1/27 >No acute intracranial abnormality.  EEG 1/28 > burst suppression seen throughout    Echo 1/28 > EF 55-60%, mildly reduced RV systolic function, mildly dilated right atrium, moderate tricuspid regurg  Micro Data:  Blood Culture 1/27 >ngtd Sputum 1/27 > neg  RVP 1/28 >Neg Strep pneumoniae 1/28 >neg MRSA neg  Antimicrobials:  Ceftriaxone 1/28 > Azithromycin 1/28 > Zosyn Vancomycin 1/27 in ED  Interim history/subjective:  Yesterday patient was found to be coughing and biting on the ETT tube so propofol was restarted   Repeat EEG yesterday again showed burst suppression.   Objective   Blood pressure 102/68, pulse 75,  temperature 98.6 F (37 C), temperature source Bladder, resp. rate 20, height 5\' 9"  (1.753 m), weight 117.9 kg, SpO2 100 %.    Vent Mode: PRVC FiO2 (%):  [40 %-50 %] 40 % Set Rate:  [15 bmp-20 bmp] 20 bmp Vt Set:  [530 mL] 530 mL PEEP:  [5 cmH20] 5 cmH20 Plateau Pressure:  [17 cmH20-19 cmH20] 18 cmH20   Intake/Output Summary (Last 24 hours) at 06/30/2018 0756 Last data filed at 06/30/2018 0700 Gross per 24 hour  Intake 3161.91 ml  Output 1255 ml  Net 1906.91 ml   Filed Weights   06/18/2018 2309 06/29/18 0500 06/30/18 0400  Weight: 100 kg 114.4 kg 117.9 kg     Examination:  General: ill but comfortable appearing  HENT: ETT, OGT  Lungs: diffuse course breath sounds Cardiovascular: regular rate and rhythm, no murmur, 2+ bl lower extremity edema  Abdomen: cooling pads in place, soft  Extremities: 2+ edema, warm to touch  Neuro: does not respond to voice or painful stimulus, does not follow commands, no spontaneous movements, pupils are small and unreactive  GU: foley in place   Resolved Hospital Problem list   Lactic acidosis   Assessment & Plan:   Post PEA/ asystolic cardiac arrest 23 minutes  Refractory shock Right heart failure  NSTEMI  Acute anoxic encephalopathy  Patient is rewarmed, nimbex and sedation have been off for over 24 hours but has not had neurologic improvement. Repeat EEG yesterday still with burst suppression, MRI has been ordered.  -appreciate neurology recommendations  - levophed to keep MAP > 65 - amiodarone   - aspirin, holding off on beta blocker or ACE/ARB given shock  and AKI    Acute hypoxic respiratory failure requiring intubation secondary to pulmonary embolism and post cardiac arrest  Respiratory Acidosis  Metabolic acidosis Pulmonary embolism  Community acquired pneumonia  Today chest xray with pulmonary edema and pleural effusions  - full vent support  - heparin gtt  - ctx and azithromycin   AKI - oliguric  Crt stable, 1.2 L  urine output yesterday   Diabetes Mellitus  Blood glucose remain elevated.  - SSI and CBG monitoring  - start lantus 3 units qd   Normocytic anemia  Iron deficiency  Hemoglobin stable, transfuse for hemoglobin less than 8. Received 1 unit of PRBC 1/28.    Best practice:  Diet: Vital HP  Pain/Anxiety/Delirium protocol (if indicated): yes VAP protocol (if indicated): Yes  DVT prophylaxis: heparin gtt  GI prophylaxis: protonix 40 mg qd  Glucose control: CBG, ISS  Mobility: Bed rest  Code Status: Full  Family Communication: nephews Jeneen Rinks and bryan updated at bedside yesterday Disposition: continue ICU monitoring   Labs   CBC: Recent Labs  Lab 06/05/2018 2214  06/27/18 0820  06/28/18 2001 06/28/18 2232 06/29/18 0317 06/29/18 0329 06/30/18 0358  WBC 12.2*  --  11.3*  --   --   --  11.5*  --  9.8  NEUTROABS 8.7*  --   --   --   --   --   --   --   --   HGB 8.3*   < > 7.7*   < > 12.2* 12.6* 9.8* 10.9* 9.1*  HCT 31.8*   < > 27.2*   < > 36.0* 37.0* 34.3* 32.0* 31.7*  MCV 84.1  --  76.6*  --   --   --  81.5  --  80.3  PLT 152  --  154  --   --   --  188  --  146*   < > = values in this interval not displayed.    Basic Metabolic Panel: Recent Labs  Lab 06/27/18 1716 06/27/18 2008  06/28/18 0501  06/28/18 1129  06/28/18 1615 06/28/18 1804 06/28/18 2001 06/28/18 2232 06/29/18 0329 06/29/18 0447 06/29/18 1850 06/30/18 0358  NA 134* 134*   < > 134*   < >  --    < >  --  136 136 136 133* 134*  --  137  K 3.2* 3.3*   < > 3.2*   < >  --    < >  --  4.2 4.3 4.3 4.6 4.4  --  3.5  CL 106 106  --  104  --   --   --   --   --   --   --   --  105  --  108  CO2 17* 16*  --  15*  --   --   --   --   --   --   --   --  19*  --  17*  GLUCOSE 114* 116*  --  169*   < >  --    < >  --  147* 145* 173*  --  194*  --  207*  BUN 23 23  --  24*  --   --   --   --   --   --   --   --  30*  --  43*  CREATININE 1.38* 1.37*  --  1.57*  --   --   --   --   --   --   --   --  2.06*  --  2.16*    CALCIUM 8.1* 8.0*  --  7.8*  --   --   --   --   --   --   --   --  7.5*  --  7.7*  MG  --   --   --  1.8  --  2.1  --  2.1  --   --   --   --  2.1 2.0 1.9  PHOS  --   --   --  3.6  --  4.0  --  4.5  --   --   --   --  6.1* 5.4* 5.3*   < > = values in this interval not displayed.   GFR: Estimated Creatinine Clearance: 37.4 mL/min (A) (by C-G formula based on SCr of 2.16 mg/dL (H)). Recent Labs  Lab 06/08/2018 2214 06/27/18 0006 06/27/18 0228 06/27/18 0659 06/27/18 0820 06/29/18 0317 06/30/18 0358  WBC 12.2*  --   --   --  11.3* 11.5* 9.8  LATICACIDVEN  --  6.1* 2.5* 2.3*  --   --   --     Liver Function Tests: Recent Labs  Lab 06/22/2018 2214 06/28/18 0501 06/29/18 0447 06/30/18 0358  AST 64* 58* 59* 51*  ALT 38 45* 44 33  ALKPHOS 100 83 96 106  BILITOT 1.3* 2.2* 1.0 0.8  PROT 6.7 5.4* 5.8* 5.4*  ALBUMIN 3.5 2.8* 2.6* 2.4*   Recent Labs  Lab 06/12/2018 2214  LIPASE 30   No results for input(s): AMMONIA in the last 168 hours.  ABG    Component Value Date/Time   PHART 7.222 (L) 06/29/2018 0329   PCO2ART 45.0 06/29/2018 0329   PO2ART 76.0 (L) 06/29/2018 0329   HCO3 18.5 (L) 06/29/2018 0329   TCO2 20 (L) 06/29/2018 0329   ACIDBASEDEF 9.0 (H) 06/29/2018 0329   O2SAT 92.0 06/29/2018 0329     Coagulation Profile: Recent Labs  Lab 06/27/18 0024 06/27/18 0251 06/27/18 0824  INR 1.36 1.50 1.62    Cardiac Enzymes: Recent Labs  Lab 06/27/18 0024 06/27/18 0659 06/27/18 1203 06/27/18 1716  TROPONINI 0.07* 0.23* 0.23* 0.20*    HbA1C: No results found for: HGBA1C  CBG: Recent Labs  Lab 06/29/18 1240 06/29/18 1611 06/29/18 2031 06/30/18 0021 06/30/18 0416  GLUCAP 171* 198* 138* 120* 193*    Review of Systems:     Past Medical History  He,  has a past medical history of Diabetes mellitus without complication ([place]), Glaucoma, Hypertension, and Prostate cancer ([place]).   Surgical History    Past Surgical History:  Procedure Laterality Date  .  PROSTATE BIOPSY    . TONSILECTOMY/ADENOIDECTOMY WITH MYRINGOTOMY       Social History   reports that he has never smoked. He has never used smokeless tobacco. He reports that he does not drink alcohol or use drugs.   Family History   His family history includes Diabetes in his mother; Heart attack in his father.   Allergies Allergies  Allergen Reactions  . Amlodipine Other (See Comments)    Headaches  . Diltiazem Other (See Comments)    Headaches     Home Medications  Prior to Admission medications   Medication Sig Start Date End Date Taking? Authorizing Provider  Canagliflozin (INVOKANA PO) Take by mouth.    [provider]  Cholecalciferol (VITAMIN D PO) Take by mouth.    [provider]  hydrochlorothiazide (HYDRODIURIL) 25 MG tablet Take 25 mg by mouth  daily.    [provider]  Iron-FA-B Cmp-C-Biot-Probiotic (FUSION PLUS) CAPS Take 1 capsule by mouth daily. 05/11/18   [provider]  metFORMIN (GLUCOPHAGE) 1000 MG tablet Take 1,000 mg by mouth 2 (two) times daily with a meal.    [provider]  ofloxacin (OCUFLOX) 0.3 % ophthalmic solution 1 drop 4 (four) times daily.    [provider]  Vitamin D, Ergocalciferol, (DRISDOL) 1.25 MG (50000 UT) CAPS capsule Take 50,000 Units by mouth once a week. 04/19/18   [provider]     Critical care time:     Attending Note:  78 year old male s/p cardiac arrest.  Was dyssynchronous with the vent overnight and propofol had to be restarted.  MRI was not done yesterday for the same reason.  No further events overnight.  On exam, unresponsive, synchronous with the vent with clear lungs on exam.  I reviewed CXR myself, ETT is in a good position.  Discussed with PCCM-NP.  MRI to be done at 930 AM today.  Once results are available will speak with neurology and plan to meet with family today.  DNR for now.  Family updated bedside.  PCCM will f/u.  The patient is critically ill  with multiple organ systems failure and requires high complexity decision making for assessment and support, frequent evaluation and titration of therapies, application of advanced monitoring technologies and extensive interpretation of multiple databases.   Critical Care Time devoted to patient care services described in this note is  35  Minutes. This time reflects time of care of this signee Dr Jennet Maduro. This critical care time does not reflect procedure time, or teaching time or supervisory time of PA/NP/Med student/Med Resident etc but could involve care discussion time.  Rush Farmer, M.D. [facility] Pulmonary/Critical Care Medicine. Pager: [phone]. After hours pager: [phone].

## 2018-06-30 NOTE — Progress Notes (Signed)
[place] for Heparin Indication: pulmonary embolus in setting of cardiac arrest, s/p code cool  Allergies  Allergen Reactions  . Amlodipine Other (See Comments)    Headaches  . Diltiazem Other (See Comments)    Headaches    Patient Measurements: Height: 5\' 9"  (175.3 cm) Weight: 259 lb 14.8 oz (117.9 kg) IBW/kg (Calculated) : 70.7 Heparin Dosing Weight: 90kg  Vital Signs: Temp: 99 F (37.2 C) (01/31 0823) Temp Source: Other (Comment) (01/31 0823) BP: 102/78 (01/31 0800) Pulse Rate: 77 (01/31 0815)  Labs: Recent Labs    06/27/18 1203 06/27/18 1716  06/28/18 0501  06/29/18 0317 06/29/18 0329 06/29/18 0447 06/29/18 0625 06/29/18 1343 06/30/18 0358 06/30/18 0616  HGB  --  9.3*   < >  --    < > 9.8* 10.9*  --   --   --  9.1*  --   HCT  --  31.3*   < >  --    < > 34.3* 32.0*  --   --   --  31.7*  --   PLT  --   --   --   --   --  188  --   --   --   --  146*  --   HEPARINUNFRC  --  0.54   < >  --    < >  --   --   --  0.35 0.38  --  0.40  CREATININE  --  1.38*   < > 1.57*  --   --   --  2.06*  --   --  2.16*  --   TROPONINI 0.23* 0.20*  --   --   --   --   --   --   --   --   --   --    < > = values in this interval not displayed.    Estimated Creatinine Clearance: 37.4 mL/min (A) (by C-G formula based on SCr of 2.16 mg/dL (H)).   Medical History: Past Medical History:  Diagnosis Date  . Diabetes mellitus without complication ([place])   . Glaucoma   . Hypertension   . Prostate cancer [facility])     Assessment: 78yo male presents after cardiac arrest, CT reveals PE w/ small clot burden though there is evidence of RHS >> s/p hypothermia protocol, now rewarmed.  Heparin level 0.4 remains within goal range on heparin drip 1150 uts/hr. No issues with heparin infusion or signs/symptoms of bleeding noted.   Goal of Therapy:  Heparin level 0.3-0.7 units/ml Monitor platelets by anticoagulation protocol: Yes   Plan:  Continue IV  heparin at current rate of 1150 units/hr Daily heparin level and CBC F/u plans for oral anticoagulation when/if able  Bonnita Nasuti Pharm.D. CPP, BCPS Clinical Pharmacist [phone] 06/30/2018 8:40 AM

## 2018-06-30 NOTE — Progress Notes (Signed)
[place] Progress Note Patient Name: Christian Fry DOB: 02-18-1941 MRN: [id number]   Date of Service  06/30/2018  HPI/Events of Note  Notified K 3.5. Creatinine today 2.16 from 2.06  eICU Interventions  Ordered K 20 meqs only per G tube     Intervention Category Major Interventions: Electrolyte abnormality - evaluation and management  Judd Lien 06/30/2018, 5:42 AM

## 2018-06-30 NOTE — Progress Notes (Signed)
Pt transported with RN to MRI and back to 2H13 on ventilator. Pt tolerated well and no complications during.

## 2018-07-01 DIAGNOSIS — J9601 Acute respiratory failure with hypoxia: Secondary | ICD-10-CM | POA: Diagnosis present

## 2018-07-01 LAB — CALCIUM, IONIZED: Calcium, Ionized, Serum: 4.4 mg/dL — ABNORMAL LOW (ref 4.5–5.6)

## 2018-07-01 MED ORDER — ACETAMINOPHEN 325 MG PO TABS: 650.0000 mg | ORAL_TABLET | Freq: Four times a day (QID) | ORAL | Status: DC | PRN

## 2018-07-01 MED ORDER — ACETAMINOPHEN 650 MG RE SUPP: 650.0000 mg | Freq: Four times a day (QID) | RECTAL | Status: DC | PRN

## 2018-07-01 MED ORDER — MORPHINE SULFATE (PF) 2 MG/ML IV SOLN: 2.0000 mg | INTRAVENOUS | Status: DC | PRN

## 2018-07-01 MED ORDER — GLYCOPYRROLATE 0.2 MG/ML IJ SOLN: 0.2000 mg | INTRAMUSCULAR | Status: DC | PRN

## 2018-07-01 MED ORDER — DIPHENHYDRAMINE HCL 50 MG/ML IJ SOLN: 25.0000 mg | INTRAMUSCULAR | Status: DC | PRN

## 2018-07-01 MED ORDER — GLYCOPYRROLATE 1 MG PO TABS
1.0000 mg | ORAL_TABLET | ORAL | Status: DC | PRN
  Filled 2018-07-01: qty 1

## 2018-07-01 MED ORDER — MORPHINE 100MG IN NS 100ML (1MG/ML) PREMIX INFUSION
0.0000 mg/h | INTRAVENOUS | Status: DC
  Administered 2018-07-01: 5 mg/h via INTRAVENOUS
  Filled 2018-07-01 (×3): qty 100

## 2018-07-01 MED ORDER — MORPHINE BOLUS VIA INFUSION
5.0000 mg | INTRAVENOUS | Status: DC | PRN
  Filled 2018-07-01: qty 5

## 2018-07-01 MED ORDER — POLYVINYL ALCOHOL 1.4 % OP SOLN
1.0000 [drp] | Freq: Four times a day (QID) | OPHTHALMIC | Status: DC | PRN
  Filled 2018-07-01: qty 15

## 2018-07-01 MED ORDER — GLYCOPYRROLATE 0.2 MG/ML IJ SOLN
0.2000 mg | INTRAMUSCULAR | Status: DC | PRN
  Administered 2018-07-01: 0.2 mg via INTRAVENOUS
  Filled 2018-07-01: qty 1

## 2018-07-01 DEATH — deceased

## 2018-07-02 LAB — CULTURE, BLOOD (ROUTINE X 2)
Culture: NO GROWTH
Culture: NO GROWTH
Special Requests: ADEQUATE

## 2018-07-05 ENCOUNTER — Telehealth: Payer: Self-pay | Admitting: *Deleted

## 2018-07-05 NOTE — Telephone Encounter (Signed)
Received D/C from [employer]-D/C Forwarded to Dr.Byrum to be signed.

## 2018-07-14 ENCOUNTER — Telehealth: Payer: Self-pay

## 2018-07-14 NOTE — Telephone Encounter (Signed)
Received 2nd copy of DC.. DC is for burial. Patient is a patient of Doctor Byrum.  DC will be taken to Pulmonary Unit for signature.

## 2018-07-14 NOTE — Telephone Encounter (Signed)
Sent to Dr.Byrum to be signed on Monday 07/17/28. PWR 07/14/18  07/18/18 received D/C  Back signed by Dr. Lamonte Sakai called [facility] @ [phone] to pick up...PWR

## 2018-07-30 NOTE — Progress Notes (Signed)
Family is beginning to gather. RN/RT awaiting family request to begin withdrawal.

## 2018-07-30 NOTE — Progress Notes (Signed)
NAME:  Christian Fry, MRN:  [id number], DOB:  21-Jun-1940, LOS: 2 ADMISSION DATE:  06/11/2018, CONSULTATION DATE:1/27 REFERRING MD:Dr. Dayna Barker, CHIEF COMPLAINT:Cardiac arrest  Brief History   78 year old man who was admit 1/27 after witnessed PEA/ asystole cardiac arrest with 23 minutes of resuscitation prior to [place]. He was found to have RLL PNA and effusions, PE with RV strain, elevated troponin, Hypercarbic respiratory and metabolic acidosis and volume overload.   Past Medical History  Diabetes Mellitus  Hypertension  Significant Hospital Events   1/27 admit after witnessed cardiac arrest with ROSC after 23 minutes, started on cooling protocol, EEG with burst suppression  1/29 7:30 PM achieved rewarming goal temperature  1/30 follow up EEG with burst suppression   Consults:  Neurology   Procedures:  ETT 1/28 > Arterial line 1/28> Left IJ CVC 1/29 >   Significant Diagnostic Tests:  CTA chest 1/27 >Positive for acute PE with left upper lobe thrombus. While clot burden is small to moderate, there is CT evidence of right heart strain (RV/LV Ratio = 2.9).Near complete consolidation throughout the right lower lobe. Dense consolidation in the perihilar left lower lobe with surrounding ground-glass opacities.  CT head 1/27 >No acute intracranial abnormality.  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  Remains unresponsive on vent . No seizure activity noted.  Family discussion with Dr. Nelda Marseille , wishes for comfort care . Labs/IV sticks d/c . Awaiting family arrival.   Objective   Blood pressure 102/68, pulse 75, temperature 98.6 F (37 C), temperature source Bladder, resp. rate 20, height 5\' 9"  (1.753 m), weight 117.9 kg, SpO2 100 %.    Vent Mode: PRVC FiO2 (%):  [40 %-50 %] 40 % Set Rate:  [15 bmp-20 bmp] 20 bmp Vt Set:  [530 mL] 530 mL PEEP:  [5 cmH20] 5 cmH20 Plateau Pressure:  [17 cmH20-19 cmH20] 18 cmH20   Intake/Output Summary (Last 24 hours) at 06/30/2018 0756 Last data filed at 06/30/2018 0700 Gross per 24 hour  Intake 3161.91 ml  Output 1255 ml  Net 1906.91 ml   Filed Weights   06/30/2018 2309 06/29/18 0500 06/30/18 0400  Weight: 100 kg 114.4 kg 117.9 kg     Examination:  General:  Chronically ill-appearing on vent   HENT:  ETT in place   Lungs:  Coarse breath sounds bilaterally  Cardiovascular:  RRR, no MRG, 2+ lower extremity edema  Abdomen:  Soft, hypoactive bowel sounds Extremities:  2+ edema   Neuro:  Unresponsive, does not follow commands pupils unreactive  GU:  Foley in place  Resolved Hospital Problem list   Lactic acidosis   Assessment & Plan:   Post PEA/ asystolic cardiac arrest 23 minutes  Refractory shock Right heart failure  NSTEMI  Acute anoxic encephalopathy  Patient is rewarmed, nimbex and sedation have been off for over 48 hr  but has not had neurologic improvement. Repeat EEG  1/30 still with burst suppression, MRI 1/31 showed diffuse anoxic injury of the brain, extensive bilateral cerebellar infarction with swelling and mass-effect -appreciate neurology recommendations  - levophed to keep MAP > 65 Family discussion 1/31 patient for comfort care measures  Acute hypoxic respiratory failure requiring intubation secondary to pulmonary embolism and  post cardiac arrest  Respiratory Acidosis  Metabolic acidosis Pulmonary embolism  Community acquired pneumonia  - full vent support for now until family arrival - AKI - oliguric  No further lab draws  Diabetes Mellitus  No further lab draws  Normocytic anemia  Iron deficiency  Hemoglobin stable,   Best practice:  Diet: Vital HP  Pain/Anxiety/Delirium protocol (if indicated): yes VAP protocol (if indicated): Yes  DVT prophylaxis: hep  GI prophylaxis: protonix 40 mg qd  Glucose control: CBG, ISS  Mobility: Bed rest  Code Status: DNR 1/31/  Family Communication: 1/31 comfort care decidsion  Disposition: ICU , comfort care when family ready .   Labs   CBC: Recent Labs  Lab 06/28/2018 2214  06/27/18 0820  06/28/18 2001 06/28/18 2232 06/29/18 0317 06/29/18 0329 06/30/18 0358  WBC 12.2*  --  11.3*  --   --   --  11.5*  --  9.8  NEUTROABS 8.7*  --   --   --   --   --   --   --   --   HGB 8.3*   < > 7.7*   < > 12.2* 12.6* 9.8* 10.9* 9.1*  HCT 31.8*   < > 27.2*   < > 36.0* 37.0* 34.3* 32.0* 31.7*  MCV 84.1  --  76.6*  --   --   --  81.5  --  80.3  PLT 152  --  154  --   --   --  188  --  146*   < > = values in this interval not displayed.    Basic Metabolic Panel: Recent Labs  Lab 06/27/18 1716 06/27/18 2008  06/28/18 0501  06/28/18 1129  06/28/18 1615 06/28/18 1804 06/28/18 2001 06/28/18 2232 06/29/18 0329 06/29/18 0447 06/29/18 1850 06/30/18 0358  NA 134* 134*   < > 134*   < >  --    < >  --  136 136 136 133* 134*  --  137  K 3.2* 3.3*   < > 3.2*   < >  --    < >  --  4.2 4.3 4.3 4.6 4.4  --  3.5  CL 106 106  --  104  --   --   --   --   --   --   --   --  105  --  108  CO2 17* 16*  --  15*  --   --   --   --   --   --   --   --  19*  --  17*  GLUCOSE 114* 116*  --  169*   < >  --    < >  --  147* 145* 173*  --  194*  --  207*  BUN 23 23  --  24*  --   --   --   --   --   --   --   --  30*  --  43*  CREATININE 1.38* 1.37*  --  1.57*  --   --   --   --    --   --   --   --  2.06*  --  2.16*  CALCIUM 8.1* 8.0*  --  7.8*  --   --   --   --   --   --   --   --  7.5*  --  7.7*  MG  --   --   --  1.8  --  2.1  --  2.1  --   --   --   --  2.1 2.0 1.9  PHOS  --   --   --  3.6  --  4.0  --  4.5  --   --   --   --  6.1* 5.4* 5.3*   < > = values in this interval not displayed.   GFR: Estimated Creatinine Clearance: 37.4 mL/min (A) (by C-G formula based on SCr of 2.16 mg/dL (H)). Recent Labs  Lab 06/05/2018 2214 06/27/18 0006 06/27/18 0228 06/27/18 0659 06/27/18 0820 06/29/18 0317 06/30/18 0358  WBC 12.2*  --   --   --  11.3* 11.5* 9.8  LATICACIDVEN  --  6.1* 2.5* 2.3*  --   --   --     Liver Function Tests: Recent Labs  Lab 06/06/2018 2214 06/28/18 0501 06/29/18 0447 06/30/18 0358  AST 64* 58* 59* 51*  ALT 38 45* 44 33  ALKPHOS 100 83 96 106  BILITOT 1.3* 2.2* 1.0 0.8  PROT 6.7 5.4* 5.8* 5.4*  ALBUMIN 3.5 2.8* 2.6* 2.4*   Recent Labs  Lab 06/06/2018 2214  LIPASE 30   No results for input(s): AMMONIA in the last 168 hours.  ABG    Component Value Date/Time   PHART 7.222 (L) 06/29/2018 0329   PCO2ART 45.0 06/29/2018 0329   PO2ART 76.0 (L) 06/29/2018 0329   HCO3 18.5 (L) 06/29/2018 0329   TCO2 20 (L) 06/29/2018 0329   ACIDBASEDEF 9.0 (H) 06/29/2018 0329   O2SAT 92.0 06/29/2018 0329     Coagulation Profile: Recent Labs  Lab 06/27/18 0024 06/27/18 0251 06/27/18 0824  INR 1.36 1.50 1.62    Cardiac Enzymes: Recent Labs  Lab 06/27/18 0024 06/27/18 0659 06/27/18 1203 06/27/18 1716  TROPONINI 0.07* 0.23* 0.23* 0.20*    HbA1C: No results found for: HGBA1C  CBG: Recent Labs  Lab 06/29/18 2031 06/30/18 0021 06/30/18 0416 06/30/18 0827 06/30/18 1204  GLUCAP 138* 120* 193* 162* 154*    Review of Systems:     Past Medical History  He,  has a past medical history of Diabetes mellitus without complication ([place]), Glaucoma, Hypertension, and Prostate cancer ([place]).   Surgical History    Past Surgical History:   Procedure Laterality Date  . PROSTATE BIOPSY    . TONSILECTOMY/ADENOIDECTOMY WITH MYRINGOTOMY       Social History   reports that he has never smoked. He has never used smokeless tobacco. He reports that he does not drink alcohol or use drugs.   Family History   His family history includes Diabetes in his mother; Heart attack in his father.   Allergies Allergies  Allergen Reactions  . Amlodipine Other (See Comments)    Headaches  . Diltiazem Other (See Comments)    Headaches     Home Medications  Prior to Admission medications   Medication Sig Start Date End Date Taking? Authorizing Provider  Canagliflozin (INVOKANA PO) Take by mouth.    [provider]  Cholecalciferol (VITAMIN D PO) Take by mouth.    [provider]  hydrochlorothiazide (HYDRODIURIL) 25 MG tablet Take 25 mg by mouth daily.  [provider]  Iron-FA-B Cmp-C-Biot-Probiotic (FUSION PLUS) CAPS Take 1 capsule by mouth daily. 05/11/18   [provider]  metFORMIN (GLUCOPHAGE) 1000 MG tablet Take 1,000 mg by mouth 2 (two) times daily with a meal.    [provider]  ofloxacin (OCUFLOX) 0.3 % ophthalmic solution 1 drop 4 (four) times daily.    [provider]  Vitamin D, Ergocalciferol, (DRISDOL) 1.25 MG (50000 UT) CAPS capsule Take 50,000 Units by mouth once a week. 04/19/18   [provider]     Critical care time:     Rexene Edison NP-C  [facility]  [phone]  07-08-2018

## 2018-07-30 NOTE — Progress Notes (Signed)
RT note-Patient extubated to room air. Withdrawal orders.

## 2018-07-30 NOTE — Progress Notes (Addendum)
Note entered at 07/02/2018 at 0229  Patient's partial plates (upper and lower) still at bedside after family left. Denture cup with plates in them put with patient to be picked but my funeral home. Patient placement notified of events.

## 2018-07-30 NOTE — Progress Notes (Signed)
TOD 2104 verified with Julaine Hua, RN. No heart tones or breath sounds auscultated. Listened for one minute. Will notify MD and CDS.

## 2018-07-30 DEATH — deceased

## 2018-08-03 DIAGNOSIS — G931 Anoxic brain damage, not elsewhere classified: Secondary | ICD-10-CM | POA: Diagnosis present

## 2018-08-03 DIAGNOSIS — R570 Cardiogenic shock: Secondary | ICD-10-CM | POA: Diagnosis present

## 2018-08-03 DIAGNOSIS — I5021 Acute systolic (congestive) heart failure: Secondary | ICD-10-CM | POA: Diagnosis present

## 2018-08-03 DIAGNOSIS — I214 Non-ST elevation (NSTEMI) myocardial infarction: Secondary | ICD-10-CM | POA: Diagnosis present

## 2018-08-03 DIAGNOSIS — I50811 Acute right heart failure: Secondary | ICD-10-CM | POA: Diagnosis present

## 2018-08-03 DIAGNOSIS — Z8639 Personal history of other endocrine, nutritional and metabolic disease: Secondary | ICD-10-CM

## 2018-08-03 DIAGNOSIS — I2609 Other pulmonary embolism with acute cor pulmonale: Secondary | ICD-10-CM | POA: Diagnosis present

## 2018-08-03 DIAGNOSIS — Z8679 Personal history of other diseases of the circulatory system: Secondary | ICD-10-CM

## 2018-08-30 NOTE — Death Summary Note (Signed)
  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[place]) Active Problems:   Cardiac arrest with pulseless electrical activity ([place])   Demand ischemia of myocardium ([place])   Pulmonary embolus ([place])   Bilateral pneumonia   Acute respiratory failure with hypoxemia ([place])   Acute pulmonary embolism with acute cor pulmonale ([facility])   Acute systolic right heart failure ([facility])   History of diabetes mellitus   History of hypertension   Non-STEMI (non-ST elevated myocardial infarction) ([place])   Cardiogenic shock [facility])   Brief Hospital Course (including significant findings, care, treatment, and services provided and events leading to death)  Christian Fry is a 78 y.o. year old male with a history of hypertension and diabetes.  He suffered a witnessed PEA cardiac arrest that evolved to asystole.  He underwent 23 minutes of resuscitation.  After stabilization he was found in the emergency department to have pulmonary embolism with RV strain and associated elevated troponin.  Also found to have bibasilar infiltrates consistent with suspected community-acquired pneumonias versus aspiration pneumonias.  He was intubated and stabilized. Course complicated by metabolic acidosis.  He was treated with broad-spectrum antibiotics.  Anticoagulation was initiated.  Therapeutic hypothermia was performed.  He remained hypotensive consistent with cardiogenic shock.  As mentioned he had a positive troponin consistent with a stress non-ST elevation MI.  After  rewarming he was unfortunately found to have evidence for myoclonic movements and an anoxic encephalopathy.  Serial EEGs were performed that showed burst suppression and indicated poor prognosis.  An MRI of his brain on 1/31 showed evidence for diffuse anoxic injury, extensive bilateral cerebellar infarction with swelling and mass-effect.  Based on his poor neurological prognosis discussions were undertaken with the patient's family to determine goals for his care.  It was determined that in absence of any significant chance for meaningful neurological recovery that the patient would not want to be maintained on artificial life support.  The decision was made to transition him off of mechanical ventilation and to comfort care. This was done 07/16/2018 and he expired the same day.    Baltazar Apo, MD, PhD 08/03/2018, 5:27 PM [facility] [phone] or if no answer [phone]

## 2021-01-07 IMAGING — DX DG CHEST 1V PORT
1 series · 1 of 1 positions shown · non-contrast
Comparison: Portable exam 6540 hours compared to 06/28/2018 at 8222
hours

CLINICAL DATA: Intubation, hypertension, diabetes mellitus

EXAM:
PORTABLE CHEST 1 VIEW

[chest ap]
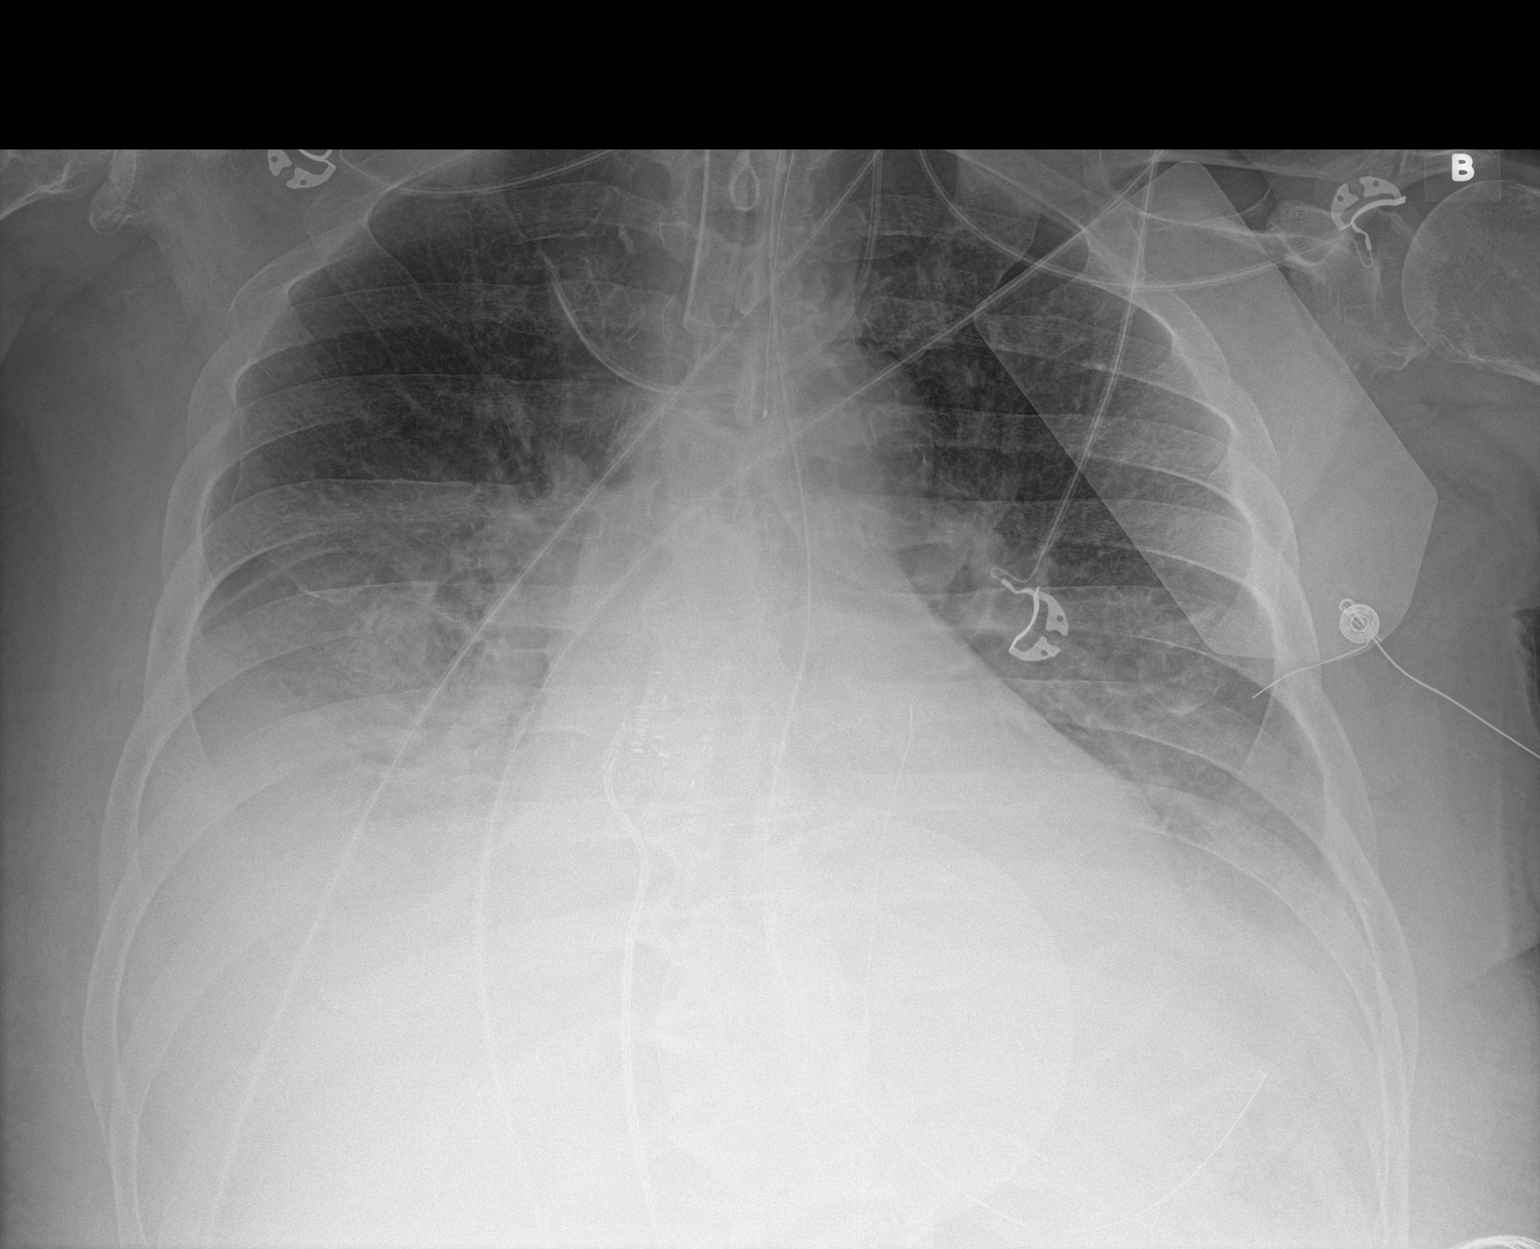

[1 of 1 positions shown; findings below may reference images not displayed]

FINDINGS: Tip of endotracheal tube projects 4.1 cm above carina.

Nasogastric tube extends into stomach.

External pacing leads present.

Upper normal heart size.

Bibasilar effusions and atelectasis.

Upper lungs clear.

Tip of LEFT jugular central line traverses the midline and is
directed towards the RIGHT subclavian vein, recommend repositioning.

No pneumothorax.
IMPRESSION: Bibasilar pleural effusions and atelectasis.

Tip of LEFT jugular line traverses the midline as directed into the
RIGHT subclavian vein, recommend repositioning.

Findings called to Aya El RN on [facility] on 06/28/2018 at 9686 hrs.
